# Patient Record
Sex: Male | Born: 1970 | Race: White | Hispanic: No | Marital: Married | Smoking: Never smoker
Health system: Southern US, Community
[De-identification: ages and names within clinical notes are randomized; demographics above are authoritative.]

## PROBLEM LIST (undated history)

## (undated) DIAGNOSIS — I10 Essential (primary) hypertension: Secondary | ICD-10-CM

## (undated) DIAGNOSIS — L509 Urticaria, unspecified: Secondary | ICD-10-CM

## (undated) DIAGNOSIS — N200 Calculus of kidney: Secondary | ICD-10-CM

## (undated) DIAGNOSIS — Z87442 Personal history of urinary calculi: Secondary | ICD-10-CM

## (undated) DIAGNOSIS — K5790 Diverticulosis of intestine, part unspecified, without perforation or abscess without bleeding: Secondary | ICD-10-CM

## (undated) DIAGNOSIS — E785 Hyperlipidemia, unspecified: Secondary | ICD-10-CM

## (undated) HISTORY — DX: Diverticulosis of intestine, part unspecified, without perforation or abscess without bleeding: K57.90

## (undated) HISTORY — DX: Calculus of kidney: N20.0

## (undated) HISTORY — DX: Essential (primary) hypertension: I10

## (undated) HISTORY — DX: Hyperlipidemia, unspecified: E78.5

## (undated) HISTORY — DX: Urticaria, unspecified: L50.9

---

## 2009-08-23 ENCOUNTER — Ambulatory Visit: Payer: Self-pay | Admitting: Nephrology

## 2009-11-20 ENCOUNTER — Ambulatory Visit: Payer: Self-pay | Admitting: Family Medicine

## 2010-11-25 ENCOUNTER — Ambulatory Visit: Payer: Self-pay | Admitting: Nephrology

## 2014-07-17 DIAGNOSIS — E785 Hyperlipidemia, unspecified: Secondary | ICD-10-CM | POA: Insufficient documentation

## 2014-07-17 DIAGNOSIS — I10 Essential (primary) hypertension: Secondary | ICD-10-CM | POA: Insufficient documentation

## 2015-04-28 ENCOUNTER — Other Ambulatory Visit: Payer: Self-pay | Admitting: Family Medicine

## 2015-04-28 NOTE — Telephone Encounter (Signed)
Last visit was October 2015; he was supposed to return April 2016 for a PE Last labs were October 2015 I'll approve Rx, but he needs appt with Dr. Jeananne Rama please

## 2015-05-08 ENCOUNTER — Encounter: Payer: Self-pay | Admitting: Family Medicine

## 2015-05-08 NOTE — Telephone Encounter (Signed)
L/M for pt to call and schedule appt.  Will also send letter.

## 2015-06-12 ENCOUNTER — Encounter: Payer: Self-pay | Admitting: Family Medicine

## 2015-06-12 ENCOUNTER — Ambulatory Visit (INDEPENDENT_AMBULATORY_CARE_PROVIDER_SITE_OTHER): Payer: BLUE CROSS/BLUE SHIELD | Admitting: Family Medicine

## 2015-06-12 VITALS — BP 135/90 | HR 67 | Ht 71.3 in | Wt 208.0 lb

## 2015-06-12 DIAGNOSIS — E785 Hyperlipidemia, unspecified: Secondary | ICD-10-CM | POA: Diagnosis not present

## 2015-06-12 DIAGNOSIS — Z23 Encounter for immunization: Secondary | ICD-10-CM

## 2015-06-12 DIAGNOSIS — I10 Essential (primary) hypertension: Secondary | ICD-10-CM | POA: Diagnosis not present

## 2015-06-12 DIAGNOSIS — Z0000 Encounter for general adult medical examination without abnormal findings: Secondary | ICD-10-CM | POA: Diagnosis not present

## 2015-06-12 LAB — UA/M W/RFLX CULTURE, ROUTINE
BILIRUBIN UA: NEGATIVE
Glucose, UA: NEGATIVE
KETONES UA: NEGATIVE
Leukocytes, UA: NEGATIVE
Nitrite, UA: NEGATIVE
PROTEIN UA: NEGATIVE
RBC, UA: NEGATIVE
SPEC GRAV UA: 1.025 (ref 1.005–1.030)
Urobilinogen, Ur: 0.2 mg/dL (ref 0.2–1.0)
pH, UA: 5 (ref 5.0–7.5)

## 2015-06-12 LAB — MICROALBUMIN, URINE WAIVED
CREATININE, URINE WAIVED: 300 mg/dL (ref 10–300)
MICROALB, UR WAIVED: 10 mg/L (ref 0–19)

## 2015-06-12 MED ORDER — LISINOPRIL 10 MG PO TABS
10.0000 mg | ORAL_TABLET | Freq: Every day | ORAL | Status: DC
Start: 2015-06-12 — End: 2015-09-24

## 2015-06-12 NOTE — Progress Notes (Signed)
BP 135/90 mmHg  Pulse 67  Ht 5' 11.3" (1.811 m)  Wt 208 lb (94.348 kg)  BMI 28.77 kg/m2  SpO2 100%   Subjective:    Patient ID: Nathaniel Young, male    DOB: [birth year], 45 y.o.   MRN: [id number]  HPI: DELNO IRIAS is a 45 y.o. male presenting on 06/12/2015 for comprehensive medical examination. Current medical complaints include:  HYPERTENSION Hypertension status: controlled  Satisfied with current treatment? yes Duration of hypertension: chronic BP monitoring frequency:  rarely BP range: 110s/80s BP medication side effects:  no Medication compliance: good compliance Aspirin: no Recurrent headaches: no Visual changes: no Palpitations: no Dyspnea: no Chest pain: no Lower extremity edema: no Dizzy/lightheaded: no  Had some problems with hemorrhoids after him being constipated. Would have some minor bleeding, no pain. Goes away with preparation H. Not having a problem now.   He currently lives with: wife Interim Problems from his last visit: no  Depression Screen done today and results listed below:  Depression screen [facility] 2/9 06/12/2015 06/12/2015  Decreased Interest 0 0  Down, Depressed, Hopeless 0 0  PHQ - 2 Score 0 0    The patient does not have a history of falls. I did not complete a risk assessment for falls. A plan of care for falls was not documented.  Past Medical History:  Past Medical History  Diagnosis Date  . Hyperlipidemia   . Hypertension   . Nephrolithiasis   . Diverticulosis   . Urticaria     Surgical History:  History reviewed. No pertinent past surgical history.  Medications:  No current outpatient prescriptions on file prior to visit.   No current facility-administered medications on file prior to visit.    Allergies:  Allergies  Allergen Reactions  . Robaxin [Methocarbamol] Rash    Social History:  Social History   Social History  . Marital Status: Married    Spouse Name: N/A  . Number of Children: N/A  . Years of Education:  N/A   Occupational History  . Not on file.   Social History Main Topics  . Smoking status: Never Smoker   . Smokeless tobacco: Never Used  . Alcohol Use: No  . Drug Use: No  . Sexual Activity: Yes   Other Topics Concern  . Not on file   Social History Narrative   History  Smoking status  . Never Smoker   Smokeless tobacco  . Never Used   History  Alcohol Use No    Family History:  Family History  Problem Relation Age of Onset  . Hypertension Mother   . Cancer Father     throat  . COPD Father   . AAA (abdominal aortic aneurysm) Father   . Aneurysm Father     brain    Past medical history, surgical history, medications, allergies, family history and social history reviewed with patient today and changes made to appropriate areas of the chart.   Review of Systems  Constitutional: Negative.   HENT: Negative.   Eyes: Negative.   Respiratory: Negative.   Cardiovascular: Negative.   Gastrointestinal: Positive for constipation and blood in stool. Negative for heartburn, nausea, vomiting, abdominal pain, diarrhea and melena.  Genitourinary: Negative.   Musculoskeletal: Positive for myalgias. Negative for back pain, joint pain, falls and neck pain.  Skin: Negative.   Neurological: Negative.   Endo/Heme/Allergies: Negative.  Negative for environmental allergies and polydipsia. Does not bruise/bleed easily.  Psychiatric/Behavioral: Negative.     All other  ROS negative except what is listed above and in the HPI.      Objective:    BP 135/90 mmHg  Pulse 67  Ht 5' 11.3" (1.811 m)  Wt 208 lb (94.348 kg)  BMI 28.77 kg/m2  SpO2 100%  Wt Readings from Last 3 Encounters:  06/12/15 208 lb (94.348 kg)  01/09/14 215 lb (97.523 kg)    Physical Exam  Constitutional: He is oriented to person, place, and time. He appears well-developed and well-nourished. No distress.  HENT:  Head: Normocephalic and atraumatic.  Right Ear: Hearing, tympanic membrane, external ear and  ear canal normal.  Left Ear: Hearing, tympanic membrane, external ear and ear canal normal.  Nose: Nose normal.  Mouth/Throat: Uvula is midline, oropharynx is clear and moist and mucous membranes are normal. No oropharyngeal exudate.  Eyes: Conjunctivae, EOM and lids are normal. Pupils are equal, round, and reactive to light. Right eye exhibits no discharge. Left eye exhibits no discharge. No scleral icterus.  Neck: Normal range of motion. Neck supple. No JVD present. No tracheal deviation present. No thyromegaly present.  Cardiovascular: Normal rate, regular rhythm, normal heart sounds and intact distal pulses.  Exam reveals no gallop and no friction rub.   No murmur heard. Pulmonary/Chest: Effort normal and breath sounds normal. No stridor. No respiratory distress. He has no wheezes. He has no rales. He exhibits no tenderness.  Abdominal: Soft. Bowel sounds are normal. He exhibits no distension and no mass. There is no tenderness. There is no rebound and no guarding. Hernia confirmed negative in the right inguinal area and confirmed negative in the left inguinal area.  Genitourinary: Testes normal and penis normal. Right testis shows no mass, no swelling and no tenderness. Right testis is descended. Cremasteric reflex is not absent on the right side. Left testis shows no swelling. Left testis is descended. Cremasteric reflex is not absent on the left side. Circumcised. No phimosis, paraphimosis, hypospadias, penile erythema or penile tenderness. No discharge found.  Musculoskeletal: Normal range of motion. He exhibits no edema or tenderness.  Lymphadenopathy:    He has no cervical adenopathy.       Right: No inguinal adenopathy present.       Left: No inguinal adenopathy present.  Neurological: He is alert and oriented to person, place, and time. He has normal reflexes. He displays normal reflexes. No cranial nerve deficit. He exhibits normal muscle tone. Coordination normal.  Skin: Skin is warm,  dry and intact. No rash noted. He is not diaphoretic. No erythema. No pallor.  Psychiatric: He has a normal mood and affect. His speech is normal and behavior is normal. Judgment and thought content normal. Cognition and memory are normal.  Nursing note and vitals reviewed.   No results found for this or any previous visit.    Assessment & Plan:   Problem List Items Addressed This Visit      Cardiovascular and Mediastinum   Hypertension    Under good control. Continue current regimen. Continue to monitor. Call with any concerns.       Relevant Medications   lisinopril (PRINIVIL,ZESTRIL) 10 MG tablet   Other Relevant Orders   Comprehensive metabolic panel   Microalbumin, Urine Waived     Other   Hyperlipidemia    Rechecking levels today. Await results. Continue diet and exercise.       Relevant Medications   lisinopril (PRINIVIL,ZESTRIL) 10 MG tablet   Other Relevant Orders   Comprehensive metabolic panel   Lipid Panel w/o  Chol/HDL Ratio    Other Visit Diagnoses    Routine general medical examination at a health care facility    -  Primary    Screening labs checked today. Up to date on his vaccines. Continue diet and exercise. Continue to monitor.     Relevant Orders    CBC with Differential/Platelet    Comprehensive metabolic panel    Lipid Panel w/o Chol/HDL Ratio    Microalbumin, Urine Waived    TSH    UA/M w/rflx Culture, Routine    Immunization due        Tdap given today.         LABORATORY TESTING:  Health maintenance labs ordered today as discussed above.   IMMUNIZATIONS:   - Tdap: Tetanus vaccination status reviewed: Td vaccination indicated and given today. - Influenza: Refused - Pneumovax: Not applicable - Prevnar: Not applicable - Zostavax vaccine: Not applicable  PATIENT COUNSELING:    Sexuality: Discussed sexually transmitted diseases, partner selection, use of condoms, avoidance of unintended pregnancy  and contraceptive alternatives.    Advised to avoid cigarette smoking.  I discussed with the patient that most people either abstain from alcohol or drink within safe limits (<=14/week and <=4 drinks/occasion for males, <=7/weeks and <= 3 drinks/occasion for females) and that the risk for alcohol disorders and other health effects rises proportionally with the number of drinks per week and how often a drinker exceeds daily limits.  Discussed cessation/primary prevention of drug use and availability of treatment for abuse.   Diet: Encouraged to adjust caloric intake to maintain  or achieve ideal body weight, to reduce intake of dietary saturated fat and total fat, to limit sodium intake by avoiding high sodium foods and not adding table salt, and to maintain adequate dietary potassium and calcium preferably from fresh fruits, vegetables, and low-fat dairy products.    stressed the importance of regular exercise  Injury prevention: Discussed safety belts, safety helmets, smoke detector, smoking near bedding or upholstery.   Dental health: Discussed importance of regular tooth brushing, flossing, and dental visits.   Follow up plan: NEXT PREVENTATIVE PHYSICAL DUE IN 1 YEAR. Return in about 6 months (around 12/13/2015) for BP check.

## 2015-06-12 NOTE — Patient Instructions (Signed)

## 2015-06-12 NOTE — Assessment & Plan Note (Signed)
Under good control. Continue current regimen. Continue to monitor. Call with any concerns. 

## 2015-06-12 NOTE — Assessment & Plan Note (Signed)
Rechecking levels today. Await results. Continue diet and exercise.  

## 2015-06-13 ENCOUNTER — Encounter: Payer: Self-pay | Admitting: Family Medicine

## 2015-06-13 LAB — COMPREHENSIVE METABOLIC PANEL
ALBUMIN: 4.5 g/dL (ref 3.5–5.5)
ALT: 26 IU/L (ref 0–44)
AST: 25 IU/L (ref 0–40)
Albumin/Globulin Ratio: 1.7 (ref 1.1–2.5)
Alkaline Phosphatase: 53 IU/L (ref 39–117)
BILIRUBIN TOTAL: 0.5 mg/dL (ref 0.0–1.2)
BUN / CREAT RATIO: 13 (ref 9–20)
BUN: 15 mg/dL (ref 6–24)
CO2: 24 mmol/L (ref 18–29)
Calcium: 9.9 mg/dL (ref 8.7–10.2)
Chloride: 100 mmol/L (ref 96–106)
Creatinine, Ser: 1.15 mg/dL (ref 0.76–1.27)
GFR calc non Af Amer: 76 mL/min/{1.73_m2} (ref >59)
GFR, EST AFRICAN AMERICAN: 88 mL/min/{1.73_m2} (ref >59)
GLOBULIN, TOTAL: 2.6 g/dL (ref 1.5–4.5)
Glucose: 112 mg/dL — ABNORMAL HIGH (ref 65–99)
Potassium: 5.1 mmol/L (ref 3.5–5.2)
Sodium: 143 mmol/L (ref 134–144)
TOTAL PROTEIN: 7.1 g/dL (ref 6.0–8.5)

## 2015-06-13 LAB — LIPID PANEL W/O CHOL/HDL RATIO
Cholesterol, Total: 239 mg/dL — ABNORMAL HIGH (ref 100–199)
HDL: 38 mg/dL — ABNORMAL LOW (ref >39)
LDL Calculated: 141 mg/dL — ABNORMAL HIGH (ref 0–99)
Triglycerides: 302 mg/dL — ABNORMAL HIGH (ref 0–149)
VLDL CHOLESTEROL CAL: 60 mg/dL — AB (ref 5–40)

## 2015-06-13 LAB — CBC WITH DIFFERENTIAL/PLATELET
BASOS: 0 %
Basophils Absolute: 0 10*3/uL (ref 0.0–0.2)
EOS (ABSOLUTE): 0.1 10*3/uL (ref 0.0–0.4)
EOS: 2 %
HEMATOCRIT: 46.4 % (ref 37.5–51.0)
HEMOGLOBIN: 15.9 g/dL (ref 12.6–17.7)
IMMATURE GRANS (ABS): 0 10*3/uL (ref 0.0–0.1)
Immature Granulocytes: 0 %
LYMPHS: 31 %
Lymphocytes Absolute: 1.7 10*3/uL (ref 0.7–3.1)
MCH: 29.6 pg (ref 26.6–33.0)
MCHC: 34.3 g/dL (ref 31.5–35.7)
MCV: 86 fL (ref 79–97)
MONOCYTES: 8 %
Monocytes Absolute: 0.4 10*3/uL (ref 0.1–0.9)
NEUTROS ABS: 3.1 10*3/uL (ref 1.4–7.0)
Neutrophils: 59 %
Platelets: 230 10*3/uL (ref 150–379)
RBC: 5.37 x10E6/uL (ref 4.14–5.80)
RDW: 14.2 % (ref 12.3–15.4)
WBC: 5.4 10*3/uL (ref 3.4–10.8)

## 2015-06-13 LAB — TSH: TSH: 0.89 u[IU]/mL (ref 0.450–4.500)

## 2015-09-24 ENCOUNTER — Encounter: Payer: Self-pay | Admitting: Family Medicine

## 2015-09-24 ENCOUNTER — Ambulatory Visit (INDEPENDENT_AMBULATORY_CARE_PROVIDER_SITE_OTHER): Payer: BLUE CROSS/BLUE SHIELD | Admitting: Family Medicine

## 2015-09-24 VITALS — BP 140/80 | HR 75 | Temp 98.7°F | Ht 71.3 in | Wt 207.0 lb

## 2015-09-24 DIAGNOSIS — I10 Essential (primary) hypertension: Secondary | ICD-10-CM | POA: Diagnosis not present

## 2015-09-24 MED ORDER — LISINOPRIL 10 MG PO TABS: 5.0000 mg | ORAL_TABLET | Freq: Every day | ORAL | Status: DC

## 2015-09-24 NOTE — Progress Notes (Signed)
BP 140/80 mmHg  Pulse 75  Temp(Src) 98.7 F (37.1 C)  Ht 5' 11.3" (1.811 m)  Wt 207 lb (93.895 kg)  BMI 28.63 kg/m2  SpO2 100%   Subjective:    Patient ID: Nathaniel Young, male    DOB: [birth year], 45 y.o.   MRN: [id number]  HPI: Nathaniel LINDSKOG is a 45 y.o. male  Chief Complaint  Patient presents with  . Hypertension    [place] he had an episode where he felt like he was going to pass out, his friend checked his bp and it was 90's/60's. He has not taken his medicine since Friday morning.   HYPERTENSION- Had 1 similar issues about 6-8 months ago when he hit his elbow really hard. Was on the boat right before this happened and his leg cramped really bad and got really cold sweat and got tingly and felt like he was going to pass out, but he didn't. Hasn't taken his BP since Friday.  Hypertension status: fluctuating  Satisfied with current treatment? no Duration of hypertension: chronic BP monitoring frequency:  rarely BP range: 120s/80s BP medication side effects:  no Medication compliance: fair compliance Previous BP meds: lisinopril Aspirin: no Recurrent headaches: no Visual changes: no Palpitations: no Dyspnea: no Chest pain: no Lower extremity edema: no Dizzy/lightheaded: yes  Relevant past medical, surgical, family and social history reviewed and updated as indicated. Interim medical history since our last visit reviewed. Allergies and medications reviewed and updated.  Review of Systems  Constitutional: Negative.   Respiratory: Negative.   Cardiovascular: Negative.   Psychiatric/Behavioral: Negative.     Per HPI unless specifically indicated above     Objective:    BP 140/80 mmHg  Pulse 75  Temp(Src) 98.7 F (37.1 C)  Ht 5' 11.3" (1.811 m)  Wt 207 lb (93.895 kg)  BMI 28.63 kg/m2  SpO2 100%  Wt Readings from Last 3 Encounters:  09/24/15 207 lb (93.895 kg)  06/12/15 208 lb (94.348 kg)  01/09/14 215 lb  (97.523 kg)   Orthostatic VS for the past 24 hrs:  BP- Lying Pulse- Lying BP- Sitting Pulse- Sitting BP- Standing at 0 minutes Pulse- Standing at 0 minutes  09/24/15 1439 133/84 mmHg 69 135/83 mmHg 66 134/88 mmHg 70   Physical Exam  Constitutional: He is oriented to person, place, and time. He appears well-developed and well-nourished. No distress.  HENT:  Head: Normocephalic and atraumatic.  Right Ear: Hearing normal.  Left Ear: Hearing normal.  Nose: Nose normal.  Eyes: Conjunctivae and lids are normal. Right eye exhibits no discharge. Left eye exhibits no discharge. No scleral icterus.  Cardiovascular: Normal rate, regular rhythm, normal heart sounds and intact distal pulses.  Exam reveals no gallop and no friction rub.   No murmur heard. Pulmonary/Chest: Effort normal and breath sounds normal. No respiratory distress. He has no wheezes. He has no rales. He exhibits no tenderness.  Musculoskeletal: Normal range of motion.  Neurological: He is alert and oriented to person, place, and time.  Skin: Skin is warm, dry and intact. No rash noted. He is not diaphoretic. No erythema. No pallor.  Psychiatric: He has a normal mood and affect. His speech is normal and behavior is normal. Judgment and thought content normal. Cognition and memory are normal.  Nursing note and vitals reviewed.   Results for orders placed or performed in visit on 06/12/15  CBC with Differential/Platelet  Result Value Ref Range  WBC 5.4 3.4 - 10.8 x10E3/uL   RBC 5.37 4.14 - 5.80 x10E6/uL   Hemoglobin 15.9 12.6 - 17.7 g/dL   Hematocrit 46.4 37.5 - 51.0 %   MCV 86 79 - 97 fL   MCH 29.6 26.6 - 33.0 pg   MCHC 34.3 31.5 - 35.7 g/dL   RDW 14.2 12.3 - 15.4 %   Platelets 230 150 - 379 x10E3/uL   Neutrophils 59 %   Lymphs 31 %   Monocytes 8 %   Eos 2 %   Basos 0 %   Neutrophils Absolute 3.1 1.4 - 7.0 x10E3/uL   Lymphocytes Absolute 1.7 0.7 - 3.1 x10E3/uL   Monocytes Absolute 0.4 0.1 - 0.9 x10E3/uL   EOS  (ABSOLUTE) 0.1 0.0 - 0.4 x10E3/uL   Basophils Absolute 0.0 0.0 - 0.2 x10E3/uL   Immature Granulocytes 0 %   Immature Grans (Abs) 0.0 0.0 - 0.1 x10E3/uL  Comprehensive metabolic panel  Result Value Ref Range   Glucose 112 (H) 65 - 99 mg/dL   BUN 15 6 - 24 mg/dL   Creatinine, Ser 1.15 0.76 - 1.27 mg/dL   GFR calc non Af Amer 76 >59 mL/min/1.73   GFR calc Af Amer 88 >59 mL/min/1.73   BUN/Creatinine Ratio 13 9 - 20   Sodium 143 134 - 144 mmol/L   Potassium 5.1 3.5 - 5.2 mmol/L   Chloride 100 96 - 106 mmol/L   CO2 24 18 - 29 mmol/L   Calcium 9.9 8.7 - 10.2 mg/dL   Total Protein 7.1 6.0 - 8.5 g/dL   Albumin 4.5 3.5 - 5.5 g/dL   Globulin, Total 2.6 1.5 - 4.5 g/dL   Albumin/Globulin Ratio 1.7 1.1 - 2.5   Bilirubin Total 0.5 0.0 - 1.2 mg/dL   Alkaline Phosphatase 53 39 - 117 IU/L   AST 25 0 - 40 IU/L   ALT 26 0 - 44 IU/L  Lipid Panel w/o Chol/HDL Ratio  Result Value Ref Range   Cholesterol, Total 239 (H) 100 - 199 mg/dL   Triglycerides 302 (H) 0 - 149 mg/dL   HDL 38 (L) >39 mg/dL   VLDL Cholesterol Cal 60 (H) 5 - 40 mg/dL   LDL Calculated 141 (H) 0 - 99 mg/dL  Microalbumin, Urine Waived  Result Value Ref Range   Microalb, Ur Waived 10 0 - 19 mg/L   Creatinine, Urine Waived 300 10 - 300 mg/dL   Microalb/Creat Ratio <30 <30 mg/g  TSH  Result Value Ref Range   TSH 0.890 0.450 - 4.500 uIU/mL  UA/M w/rflx Culture, Routine  Result Value Ref Range   Specific Gravity, UA 1.025 1.005 - 1.030   pH, UA 5.0 5.0 - 7.5   Color, UA Yellow Yellow   Appearance Ur Clear Clear   Leukocytes, UA Negative Negative   Protein, UA Negative Negative/Trace   Glucose, UA Negative Negative   Ketones, UA Negative Negative   RBC, UA Negative Negative   Bilirubin, UA Negative Negative   Urobilinogen, Ur 0.2 0.2 - 1.0 mg/dL   Nitrite, UA Negative Negative      Assessment & Plan:   Problem List Items Addressed This Visit      Cardiovascular and Mediastinum   Hypertension - Primary    [place] now [place] medicine and BP elevated. Will restart 5mg  of lisinopril. Will check for electrolyte abnormalities and CBC. Await results. Recheck 1 month. No orthostasis today.      Relevant Medications   lisinopril (  PRINIVIL,ZESTRIL) 10 MG tablet   Other Relevant Orders   CBC with Differential/Platelet   Comprehensive metabolic panel       Follow up plan: Return in about 4 weeks (around 10/22/2015) for BP follow up.

## 2015-09-24 NOTE — Assessment & Plan Note (Addendum)
[place] now [place] and BP elevated. Will restart 5mg  of lisinopril. Will check for electrolyte abnormalities and CBC. Await results. Recheck 1 month. No orthostasis today.

## 2015-09-25 ENCOUNTER — Encounter: Payer: Self-pay | Admitting: Family Medicine

## 2015-09-25 LAB — COMPREHENSIVE METABOLIC PANEL
A/G RATIO: 1.7 (ref 1.2–2.2)
ALBUMIN: 4.4 g/dL (ref 3.5–5.5)
ALT: 20 IU/L (ref 0–44)
AST: 18 IU/L (ref 0–40)
Alkaline Phosphatase: 49 IU/L (ref 39–117)
BILIRUBIN TOTAL: 0.6 mg/dL (ref 0.0–1.2)
BUN / CREAT RATIO: 11 (ref 9–20)
BUN: 12 mg/dL (ref 6–24)
CALCIUM: 9.8 mg/dL (ref 8.7–10.2)
CHLORIDE: 101 mmol/L (ref 96–106)
CO2: 27 mmol/L (ref 18–29)
Creatinine, Ser: 1.11 mg/dL (ref 0.76–1.27)
GFR, EST AFRICAN AMERICAN: 92 mL/min/{1.73_m2} (ref >59)
GFR, EST NON AFRICAN AMERICAN: 80 mL/min/{1.73_m2} (ref >59)
GLOBULIN, TOTAL: 2.6 g/dL (ref 1.5–4.5)
Glucose: 71 mg/dL (ref 65–99)
POTASSIUM: 5 mmol/L (ref 3.5–5.2)
SODIUM: 144 mmol/L (ref 134–144)
TOTAL PROTEIN: 7 g/dL (ref 6.0–8.5)

## 2015-09-25 LAB — CBC WITH DIFFERENTIAL/PLATELET
BASOS: 0 %
Basophils Absolute: 0 10*3/uL (ref 0.0–0.2)
EOS (ABSOLUTE): 0.1 10*3/uL (ref 0.0–0.4)
EOS: 2 %
HEMATOCRIT: 44 % (ref 37.5–51.0)
HEMOGLOBIN: 14.8 g/dL (ref 12.6–17.7)
IMMATURE GRANS (ABS): 0 10*3/uL (ref 0.0–0.1)
IMMATURE GRANULOCYTES: 0 %
LYMPHS: 32 %
Lymphocytes Absolute: 2.1 10*3/uL (ref 0.7–3.1)
MCH: 29.1 pg (ref 26.6–33.0)
MCHC: 33.6 g/dL (ref 31.5–35.7)
MCV: 87 fL (ref 79–97)
Monocytes Absolute: 0.5 10*3/uL (ref 0.1–0.9)
Monocytes: 7 %
NEUTROS ABS: 3.8 10*3/uL (ref 1.4–7.0)
NEUTROS PCT: 59 %
PLATELETS: 233 10*3/uL (ref 150–379)
RBC: 5.08 x10E6/uL (ref 4.14–5.80)
RDW: 13.4 % (ref 12.3–15.4)
WBC: 6.5 10*3/uL (ref 3.4–10.8)

## 2015-10-29 ENCOUNTER — Other Ambulatory Visit: Payer: Self-pay | Admitting: Family Medicine

## 2015-10-29 ENCOUNTER — Ambulatory Visit (INDEPENDENT_AMBULATORY_CARE_PROVIDER_SITE_OTHER): Payer: BLUE CROSS/BLUE SHIELD | Admitting: Family Medicine

## 2015-10-29 ENCOUNTER — Encounter: Payer: Self-pay | Admitting: Family Medicine

## 2015-10-29 DIAGNOSIS — I10 Essential (primary) hypertension: Secondary | ICD-10-CM

## 2015-10-29 MED ORDER — LISINOPRIL 5 MG PO TABS: 5.0000 mg | ORAL_TABLET | Freq: Every day | ORAL | 6 refills | Status: DC

## 2015-10-29 MED ORDER — LISINOPRIL 5 MG PO TABS: 5.0000 mg | ORAL_TABLET | Freq: Every day | ORAL | 9 refills | Status: DC

## 2015-10-29 NOTE — Progress Notes (Signed)
BP 126/79 (BP Location: Left Arm, Patient Position: Sitting, Cuff Size: Normal)   Pulse 74   Temp 98.1 F (36.7 C)   Wt 204 lb (92.5 kg)   SpO2 100%   BMI 28.21 kg/m    Subjective:    Patient ID: Nathaniel Young, male    DOB: 07/13/1970, 45 y.o.   MRN: [id number]  HPI: Nathaniel Young is a 45 y.o. male  Chief Complaint  Patient presents with  . Hypertension   HYPERTENSION Hypertension status: controlled  Satisfied with current treatment? yes Duration of hypertension: chronic BP monitoring frequency:  not checking BP medication side effects:  no Medication compliance: excellent compliance Aspirin: no Recurrent headaches: no Visual changes: no Palpitations: no Dyspnea: no Chest pain: no Lower extremity edema: no Dizzy/lightheaded: no  Relevant past medical, surgical, family and social history reviewed and updated as indicated. Interim medical history since our last visit reviewed. Allergies and medications reviewed and updated.  Review of Systems  Constitutional: Negative.   Respiratory: Negative.   Cardiovascular: Negative.   Psychiatric/Behavioral: Negative.     Per HPI unless specifically indicated above     Objective:    BP 126/79 (BP Location: Left Arm, Patient Position: Sitting, Cuff Size: Normal)   Pulse 74   Temp 98.1 F (36.7 C)   Wt 204 lb (92.5 kg)   SpO2 100%   BMI 28.21 kg/m   Wt Readings from Last 3 Encounters:  10/29/15 204 lb (92.5 kg)  09/24/15 207 lb (93.9 kg)  06/12/15 208 lb (94.3 kg)    Physical Exam  Constitutional: He is oriented to person, place, and time. He appears well-developed and well-nourished. No distress.  HENT:  Head: Normocephalic and atraumatic.  Right Ear: Hearing normal.  Left Ear: Hearing normal.  Nose: Nose normal.  Eyes: Conjunctivae and lids are normal. Right eye exhibits no discharge. Left eye exhibits no discharge. No scleral icterus.  Cardiovascular: Normal rate, regular rhythm, normal heart sounds  and intact distal pulses.  Exam reveals no gallop and no friction rub.   No murmur heard. Pulmonary/Chest: Effort normal and breath sounds normal. No respiratory distress. He has no wheezes. He has no rales. He exhibits no tenderness.  Musculoskeletal: Normal range of motion.  Neurological: He is alert and oriented to person, place, and time.  Skin: Skin is intact. No rash noted.  Psychiatric: He has a normal mood and affect. His speech is normal and behavior is normal. Judgment and thought content normal. Cognition and memory are normal.  Nursing note and vitals reviewed.   Results for orders placed or performed in visit on 09/24/15  CBC with Differential/Platelet  Result Value Ref Range   WBC 6.5 3.4 - 10.8 x10E3/uL   RBC 5.08 4.14 - 5.80 x10E6/uL   Hemoglobin 14.8 12.6 - 17.7 g/dL   Hematocrit 44.0 37.5 - 51.0 %   MCV 87 79 - 97 fL   MCH 29.1 26.6 - 33.0 pg   MCHC 33.6 31.5 - 35.7 g/dL   RDW 13.4 12.3 - 15.4 %   Platelets 233 150 - 379 x10E3/uL   Neutrophils 59 %   Lymphs 32 %   Monocytes 7 %   Eos 2 %   Basos 0 %   Neutrophils Absolute 3.8 1.4 - 7.0 x10E3/uL   Lymphocytes Absolute 2.1 0.7 - 3.1 x10E3/uL   Monocytes Absolute 0.5 0.1 - 0.9 x10E3/uL   EOS (ABSOLUTE) 0.1 0.0 - 0.4 x10E3/uL   Basophils Absolute 0.0 0.0 -  0.2 x10E3/uL   Immature Granulocytes 0 %   Immature Grans (Abs) 0.0 0.0 - 0.1 x10E3/uL  Comprehensive metabolic panel  Result Value Ref Range   Glucose 71 65 - 99 mg/dL   BUN 12 6 - 24 mg/dL   Creatinine, Ser 1.11 0.76 - 1.27 mg/dL   GFR calc non Af Amer 80 >59 mL/min/1.73   GFR calc Af Amer 92 >59 mL/min/1.73   BUN/Creatinine Ratio 11 9 - 20   Sodium 144 134 - 144 mmol/L   Potassium 5.0 3.5 - 5.2 mmol/L   Chloride 101 96 - 106 mmol/L   CO2 27 18 - 29 mmol/L   Calcium 9.8 8.7 - 10.2 mg/dL   Total Protein 7.0 6.0 - 8.5 g/dL   Albumin 4.4 3.5 - 5.5 g/dL   Globulin, Total 2.6 1.5 - 4.5 g/dL   Albumin/Globulin Ratio 1.7 1.2 - 2.2   Bilirubin Total 0.6  0.0 - 1.2 mg/dL   Alkaline Phosphatase 49 39 - 117 IU/L   AST 18 0 - 40 IU/L   ALT 20 0 - 44 IU/L      Assessment & Plan:   Problem List Items Addressed This Visit      Cardiovascular and Mediastinum   Hypertension    Under good control. Continue current regimen. Continue to monitor. Call with any concerns. Recheck at physical in March.      Relevant Medications   lisinopril (PRINIVIL,ZESTRIL) 5 MG tablet    Other Visit Diagnoses   None.      Follow up plan: Return After 06/11/16, for physical.

## 2015-10-29 NOTE — Assessment & Plan Note (Signed)
Under good control. Continue current regimen. Continue to monitor. Call with any concerns. Recheck at physical in March.

## 2015-10-30 LAB — BASIC METABOLIC PANEL
BUN/Creatinine Ratio: 13 (ref 9–20)
BUN: 15 mg/dL (ref 6–24)
CALCIUM: 9.7 mg/dL (ref 8.7–10.2)
CHLORIDE: 101 mmol/L (ref 96–106)
CO2: 26 mmol/L (ref 18–29)
Creatinine, Ser: 1.18 mg/dL (ref 0.76–1.27)
GFR calc non Af Amer: 74 mL/min/{1.73_m2} (ref >59)
GFR, EST AFRICAN AMERICAN: 86 mL/min/{1.73_m2} (ref >59)
GLUCOSE: 84 mg/dL (ref 65–99)
POTASSIUM: 5 mmol/L (ref 3.5–5.2)
Sodium: 143 mmol/L (ref 134–144)

## 2015-12-13 ENCOUNTER — Ambulatory Visit: Payer: BLUE CROSS/BLUE SHIELD | Admitting: Family Medicine

## 2016-06-12 ENCOUNTER — Ambulatory Visit (INDEPENDENT_AMBULATORY_CARE_PROVIDER_SITE_OTHER): Payer: BLUE CROSS/BLUE SHIELD | Admitting: Family Medicine

## 2016-06-12 ENCOUNTER — Encounter: Payer: Self-pay | Admitting: Family Medicine

## 2016-06-12 VITALS — BP 132/89 | HR 79 | Temp 98.1°F | Resp 17 | Ht 71.5 in | Wt 203.0 lb

## 2016-06-12 DIAGNOSIS — E782 Mixed hyperlipidemia: Secondary | ICD-10-CM

## 2016-06-12 DIAGNOSIS — I10 Essential (primary) hypertension: Secondary | ICD-10-CM | POA: Diagnosis not present

## 2016-06-12 DIAGNOSIS — Z0000 Encounter for general adult medical examination without abnormal findings: Secondary | ICD-10-CM

## 2016-06-12 LAB — MICROALBUMIN, URINE WAIVED
CREATININE, URINE WAIVED: 300 mg/dL (ref 10–300)
Microalb, Ur Waived: 30 mg/L — ABNORMAL HIGH (ref 0–19)

## 2016-06-12 LAB — UA/M W/RFLX CULTURE, ROUTINE
BILIRUBIN UA: NEGATIVE
GLUCOSE, UA: NEGATIVE
KETONES UA: NEGATIVE
Leukocytes, UA: NEGATIVE
NITRITE UA: NEGATIVE
PROTEIN UA: NEGATIVE
RBC UA: NEGATIVE
Specific Gravity, UA: >1.03 — ABNORMAL HIGH (ref 1.005–1.030)
Urobilinogen, Ur: 0.2 mg/dL (ref 0.2–1.0)
pH, UA: 5.5 (ref 5.0–7.5)

## 2016-06-12 LAB — MICROSCOPIC EXAMINATION
Bacteria, UA: NONE SEEN
Epithelial Cells (non renal): NONE SEEN /hpf (ref 0–10)
RBC MICROSCOPIC, UA: NONE SEEN /HPF (ref 0–?)
WBC UA: NONE SEEN /HPF (ref 0–?)

## 2016-06-12 MED ORDER — LISINOPRIL 5 MG PO TABS: 5.0000 mg | ORAL_TABLET | Freq: Every day | ORAL | 9 refills | Status: DC

## 2016-06-12 NOTE — Progress Notes (Signed)
BP 132/89 (BP Location: Left Arm, Patient Position: Sitting, Cuff Size: Normal)   Pulse 79   Temp 98.1 F (36.7 C) (Oral)   Resp 17   Ht 5' 11.5" (1.816 m)   Wt 203 lb (92.1 kg)   SpO2 98%   BMI 27.92 kg/m    Subjective:    Patient ID: Nathaniel Young, male    DOB: Jan 31, 1971, 46 y.o.   MRN: [id number]  HPI: Nathaniel Young is a 46 y.o. male presenting on 06/12/2016 for comprehensive medical examination. Current medical complaints include:  HYPERTENSION Hypertension status: controlled  Satisfied with current treatment? yes Duration of hypertension: chronic BP monitoring frequency:  not checking BP medication side effects:  no Medication compliance: excellent compliance Previous BP meds:lisinopril Aspirin: no Recurrent headaches: no Visual changes: no Palpitations: no Dyspnea: no Chest pain: no Lower extremity edema: no Dizzy/lightheaded: no  He currently lives with: wife Interim Problems from his last visit: no  Depression Screen done today and results listed below:  Depression screen [facility] 2/9 06/12/2016 06/12/2015 06/12/2015  Decreased Interest 0 0 0  Down, Depressed, Hopeless 0 0 0  PHQ - 2 Score 0 0 0    Past Medical History:  Past Medical History:  Diagnosis Date  . Diverticulosis   . Hyperlipidemia   . Hypertension   . Nephrolithiasis   . Urticaria     Surgical History:  No past surgical history on file.  Medications:  No current outpatient prescriptions on file prior to visit.   No current facility-administered medications on file prior to visit.     Allergies:  Allergies  Allergen Reactions  . Robaxin [Methocarbamol] Rash    Social History:  Social History   Social History  . Marital status: Married    Spouse name: N/A  . Number of children: N/A  . Years of education: N/A   Occupational History  . Not on file.   Social History Main Topics  . Smoking status: Never Smoker  . Smokeless tobacco: Never Used  . Alcohol use No  . Drug  use: No  . Sexual activity: Yes   Other Topics Concern  . Not on file   Social History Narrative  . No narrative on file   History  Smoking Status  . Never Smoker  Smokeless Tobacco  . Never Used   History  Alcohol Use No    Family History:  Family History  Problem Relation Age of Onset  . Hypertension Mother   . Cancer Father     throat  . COPD Father   . AAA (abdominal aortic aneurysm) Father   . Aneurysm Father     brain    Past medical history, surgical history, medications, allergies, family history and social history reviewed with patient today and changes made to appropriate areas of the chart.   Review of Systems  Constitutional: Negative.   HENT: Negative.   Eyes: Negative.   Respiratory: Negative.   Cardiovascular: Negative.   Gastrointestinal: Negative.   Genitourinary: Negative.   Musculoskeletal: Negative.   Skin: Negative.   Neurological: Negative.   Endo/Heme/Allergies: Negative.   Psychiatric/Behavioral: Negative.     All other ROS negative except what is listed above and in the HPI.      Objective:    BP 132/89 (BP Location: Left Arm, Patient Position: Sitting, Cuff Size: Normal)   Pulse 79   Temp 98.1 F (36.7 C) (Oral)   Resp 17   Ht 5' 11.5" (1.816 m)  Wt 203 lb (92.1 kg)   SpO2 98%   BMI 27.92 kg/m   Wt Readings from Last 3 Encounters:  06/12/16 203 lb (92.1 kg)  10/29/15 204 lb (92.5 kg)  09/24/15 207 lb (93.9 kg)    Physical Exam  Constitutional: He is oriented to person, place, and time. He appears well-developed and well-nourished. No distress.  HENT:  Head: Normocephalic and atraumatic.  Right Ear: Hearing, tympanic membrane, external ear and ear canal normal.  Left Ear: Hearing, tympanic membrane, external ear and ear canal normal.  Nose: Nose normal.  Mouth/Throat: Uvula is midline, oropharynx is clear and moist and mucous membranes are normal. No oropharyngeal exudate.  Eyes: Conjunctivae, EOM and lids are  normal. Pupils are equal, round, and reactive to light. Right eye exhibits no discharge. Left eye exhibits no discharge. No scleral icterus.  Neck: Normal range of motion. Neck supple. No JVD present. No tracheal deviation present. No thyromegaly present.  Cardiovascular: Normal rate, regular rhythm, normal heart sounds and intact distal pulses.  Exam reveals no gallop and no friction rub.   No murmur heard. Pulmonary/Chest: Effort normal and breath sounds normal. No stridor. No respiratory distress. He has no wheezes. He has no rales. He exhibits no tenderness.  Abdominal: Soft. Bowel sounds are normal. He exhibits no distension and no mass. There is no tenderness. There is no rebound and no guarding. Hernia confirmed negative in the right inguinal area and confirmed negative in the left inguinal area.  Genitourinary: Testes normal and penis normal. Right testis shows no mass, no swelling and no tenderness. Right testis is descended. Cremasteric reflex is not absent on the right side. Left testis shows no mass, no swelling and no tenderness. Left testis is descended. Cremasteric reflex is not absent on the left side. Circumcised. No penile tenderness.  Musculoskeletal: Normal range of motion. He exhibits no edema, tenderness or deformity.  Lymphadenopathy:    He has no cervical adenopathy.       Right: No inguinal adenopathy present.       Left: No inguinal adenopathy present.  Neurological: He is alert and oriented to person, place, and time. He has normal reflexes. He displays normal reflexes. No cranial nerve deficit. He exhibits normal muscle tone. Coordination normal.  Skin: Skin is warm, dry and intact. No rash noted. He is not diaphoretic. No erythema. No pallor.  Psychiatric: He has a normal mood and affect. His speech is normal and behavior is normal. Judgment and thought content normal. Cognition and memory are normal.  Nursing note and vitals reviewed.   Results for orders placed or  performed in visit on 01/31/24  Basic metabolic panel  Result Value Ref Range   Glucose 84 65 - 99 mg/dL   BUN 15 6 - 24 mg/dL   Creatinine, Ser 1.18 0.76 - 1.27 mg/dL   GFR calc non Af Amer 74 >59 mL/min/1.73   GFR calc Af Amer 86 >59 mL/min/1.73   BUN/Creatinine Ratio 13 9 - 20   Sodium 143 134 - 144 mmol/L   Potassium 5.0 3.5 - 5.2 mmol/L   Chloride 101 96 - 106 mmol/L   CO2 26 18 - 29 mmol/L   Calcium 9.7 8.7 - 10.2 mg/dL      Assessment & Plan:   Problem List Items Addressed This Visit      Cardiovascular and Mediastinum   Hypertension    Under good control. Continue current regimen. Continue to monitor. Recheck 6 months.  Relevant Medications   lisinopril (PRINIVIL,ZESTRIL) 5 MG tablet   Other Relevant Orders   CBC with Differential/Platelet   Comprehensive metabolic panel   Microalbumin, Urine Waived   TSH   UA/M w/rflx Culture, Routine     Other   Hyperlipidemia    Rechecking levels today. Call with any concerns. Await results.       Relevant Medications   lisinopril (PRINIVIL,ZESTRIL) 5 MG tablet   Other Relevant Orders   Comprehensive metabolic panel   Lipid Panel w/o Chol/HDL Ratio    Other Visit Diagnoses    Routine general medical examination at a health care facility    -  Primary   Up to date on vaccines. Screening labs checked today. Call with any concerns. Recheck 1 year.    Relevant Orders   CBC with Differential/Platelet   Comprehensive metabolic panel   Lipid Panel w/o Chol/HDL Ratio   Microalbumin, Urine Waived   TSH   UA/M w/rflx Culture, Routine      LABORATORY TESTING:  Health maintenance labs ordered today as discussed above.   IMMUNIZATIONS:   - Tdap: Tetanus vaccination status reviewed: last tetanus booster within 10 years. - Influenza: Refused - Pneumovax: Not applicable  PATIENT COUNSELING:    Sexuality: Discussed sexually transmitted diseases, partner selection, use of condoms, avoidance of unintended pregnancy  and  contraceptive alternatives.   Advised to avoid cigarette smoking.  I discussed with the patient that most people either abstain from alcohol or drink within safe limits (<=14/week and <=4 drinks/occasion for males, <=7/weeks and <= 3 drinks/occasion for females) and that the risk for alcohol disorders and other health effects rises proportionally with the number of drinks per week and how often a drinker exceeds daily limits.  Discussed cessation/primary prevention of drug use and availability of treatment for abuse.   Diet: Encouraged to adjust caloric intake to maintain  or achieve ideal body weight, to reduce intake of dietary saturated fat and total fat, to limit sodium intake by avoiding high sodium foods and not adding table salt, and to maintain adequate dietary potassium and calcium preferably from fresh fruits, vegetables, and low-fat dairy products.    stressed the importance of regular exercise  Injury prevention: Discussed safety belts, safety helmets, smoke detector, smoking near bedding or upholstery.   Dental health: Discussed importance of regular tooth brushing, flossing, and dental visits.   Follow up plan: NEXT PREVENTATIVE PHYSICAL DUE IN 1 YEAR. Return in about 6 months (around 12/13/2016) for Follow up BP.

## 2016-06-12 NOTE — Patient Instructions (Addendum)
 Health Maintenance, Male A healthy lifestyle and preventive care is important for your health and wellness. Ask your health care provider about what schedule of regular examinations is right for you. What should I know about weight and diet?  Eat a Healthy Diet  Eat plenty of vegetables, fruits, whole grains, low-fat dairy products, and lean protein.  Do not eat a lot of foods high in solid fats, added sugars, or salt. Maintain a Healthy Weight  Regular exercise can help you achieve or maintain a healthy weight. You should:  Do at least 150 minutes of exercise each week. The exercise should increase your heart rate and make you sweat (moderate-intensity exercise).  Do strength-training exercises at least twice a week. Watch Your Levels of Cholesterol and Blood Lipids  Have your blood tested for lipids and cholesterol every 5 years starting at 47 years of age. If you are at high risk for heart disease, you should start having your blood tested when you are 47 years old. You may need to have your cholesterol levels checked more often if:  Your lipid or cholesterol levels are high.  You are older than 47 years of age.  You are at high risk for heart disease. What should I know about cancer screening? Many types of cancers can be detected early and may often be prevented. Lung Cancer  You should be screened every year for lung cancer if:  You are a current smoker who has smoked for at least 30 years.  You are a former smoker who has quit within the past 15 years.  Talk to your health care provider about your screening options, when you should start screening, and how often you should be screened. Colorectal Cancer  Routine colorectal cancer screening usually begins at 47 years of age and should be repeated every 5-10 years until you are 47 years old. You may need to be screened more often if early forms of precancerous polyps or small growths are found. Your health care provider  may recommend screening at an earlier age if you have risk factors for colon cancer.  Your health care provider may recommend using home test kits to check for hidden blood in the stool.  A small camera at the end of a tube can be used to examine your colon (sigmoidoscopy or colonoscopy). This checks for the earliest forms of colorectal cancer. Prostate and Testicular Cancer  Depending on your age and overall health, your health care provider may do certain tests to screen for prostate and testicular cancer.  Talk to your health care provider about any symptoms or concerns you have about testicular or prostate cancer. Skin Cancer  Check your skin from head to toe regularly.  Tell your health care provider about any new moles or changes in moles, especially if:  There is a change in a mole's size, shape, or color.  You have a mole that is larger than a pencil eraser.  Always use sunscreen. Apply sunscreen liberally and repeat throughout the day.  Protect yourself by wearing long sleeves, pants, a wide-brimmed hat, and sunglasses when outside. What should I know about heart disease, diabetes, and high blood pressure?  If you are 18-39 years of age, have your blood pressure checked every 3-5 years. If you are 40 years of age or older, have your blood pressure checked every year. You should have your blood pressure measured twice-once when you are at a hospital or clinic, and once when you are not at   a hospital or clinic. Record the average of the two measurements. To check your blood pressure when you are not at a hospital or clinic, you can use:  An automated blood pressure machine at a pharmacy.  A home blood pressure monitor.  Talk to your health care provider about your target blood pressure.  If you are between 45-79 years old, ask your health care provider if you should take aspirin to prevent heart disease.  Have regular diabetes screenings by checking your fasting blood sugar  level.  If you are at a normal weight and have a low risk for diabetes, have this test once every three years after the age of 45.  If you are overweight and have a high risk for diabetes, consider being tested at a younger age or more often.  A one-time screening for abdominal aortic aneurysm (AAA) by ultrasound is recommended for men aged 65-75 years who are current or former smokers. What should I know about preventing infection? Hepatitis B  If you have a higher risk for hepatitis B, you should be screened for this virus. Talk with your health care provider to find out if you are at risk for hepatitis B infection. Hepatitis C  Blood testing is recommended for:  Everyone born from 1945 through 1965.  Anyone with known risk factors for hepatitis C. Sexually Transmitted Diseases (STDs)  You should be screened each year for STDs including gonorrhea and chlamydia if:  You are sexually active and are younger than 47 years of age.  You are older than 47 years of age and your health care provider tells you that you are at risk for this type of infection.  Your sexual activity has changed since you were last screened and you are at an increased risk for chlamydia or gonorrhea. Ask your health care provider if you are at risk.  Talk with your health care provider about whether you are at high risk of being infected with HIV. Your health care provider may recommend a prescription medicine to help prevent HIV infection. What else can I do?  Schedule regular health, dental, and eye exams.  Stay current with your vaccines (immunizations).  Do not use any tobacco products, such as cigarettes, chewing tobacco, and e-cigarettes. If you need help quitting, ask your health care provider.  Limit alcohol intake to no more than 2 drinks per day. One drink equals 12 ounces of beer, 5 ounces of wine, or 1 ounces of hard liquor.  Do not use street drugs.  Do not share needles.  Ask your health  care provider for help if you need support or information about quitting drugs.  Tell your health care provider if you often feel depressed.  Tell your health care provider if you have ever been abused or do not feel safe at home. This information is not intended to replace advice given to you by your health care provider. Make sure you discuss any questions you have with your health care provider. Document Released: 09/19/2007 Document Revised: 11/20/2015 Document Reviewed: 12/25/2014 Elsevier Interactive Patient Education  2017 [employer]  

## 2016-06-12 NOTE — Assessment & Plan Note (Signed)
Under good control. Continue current regimen. Continue to monitor. Recheck 6 months.  

## 2016-06-12 NOTE — Assessment & Plan Note (Signed)
Rechecking levels today. Call with any concerns. Await results.  

## 2016-06-13 LAB — CBC WITH DIFFERENTIAL/PLATELET
BASOS: 0 %
Basophils Absolute: 0 10*3/uL (ref 0.0–0.2)
EOS (ABSOLUTE): 0.1 10*3/uL (ref 0.0–0.4)
EOS: 2 %
HEMATOCRIT: 46.6 % (ref 37.5–51.0)
Hemoglobin: 15.7 g/dL (ref 13.0–17.7)
Immature Grans (Abs): 0 10*3/uL (ref 0.0–0.1)
Immature Granulocytes: 0 %
LYMPHS ABS: 1.5 10*3/uL (ref 0.7–3.1)
Lymphs: 27 %
MCH: 29 pg (ref 26.6–33.0)
MCHC: 33.7 g/dL (ref 31.5–35.7)
MCV: 86 fL (ref 79–97)
MONOS ABS: 0.5 10*3/uL (ref 0.1–0.9)
Monocytes: 9 %
NEUTROS ABS: 3.4 10*3/uL (ref 1.4–7.0)
NEUTROS PCT: 62 %
PLATELETS: 247 10*3/uL (ref 150–379)
RBC: 5.41 x10E6/uL (ref 4.14–5.80)
RDW: 13.7 % (ref 12.3–15.4)
WBC: 5.6 10*3/uL (ref 3.4–10.8)

## 2016-06-13 LAB — COMPREHENSIVE METABOLIC PANEL
A/G RATIO: 1.7 (ref 1.2–2.2)
ALBUMIN: 4.5 g/dL (ref 3.5–5.5)
ALK PHOS: 54 IU/L (ref 39–117)
ALT: 21 IU/L (ref 0–44)
AST: 18 IU/L (ref 0–40)
BILIRUBIN TOTAL: 0.9 mg/dL (ref 0.0–1.2)
BUN / CREAT RATIO: 17 (ref 9–20)
BUN: 17 mg/dL (ref 6–24)
CHLORIDE: 100 mmol/L (ref 96–106)
CO2: 27 mmol/L (ref 18–29)
Calcium: 9.6 mg/dL (ref 8.7–10.2)
Creatinine, Ser: 1.02 mg/dL (ref 0.76–1.27)
GFR calc Af Amer: 101 mL/min/{1.73_m2} (ref >59)
GFR calc non Af Amer: 88 mL/min/{1.73_m2} (ref >59)
GLOBULIN, TOTAL: 2.7 g/dL (ref 1.5–4.5)
Glucose: 110 mg/dL — ABNORMAL HIGH (ref 65–99)
Potassium: 4.6 mmol/L (ref 3.5–5.2)
SODIUM: 140 mmol/L (ref 134–144)
Total Protein: 7.2 g/dL (ref 6.0–8.5)

## 2016-06-13 LAB — LIPID PANEL W/O CHOL/HDL RATIO
CHOLESTEROL TOTAL: 245 mg/dL — AB (ref 100–199)
HDL: 41 mg/dL (ref >39)
LDL CALC: 164 mg/dL — AB (ref 0–99)
Triglycerides: 202 mg/dL — ABNORMAL HIGH (ref 0–149)
VLDL Cholesterol Cal: 40 mg/dL (ref 5–40)

## 2016-06-13 LAB — TSH: TSH: 0.669 u[IU]/mL (ref 0.450–4.500)

## 2016-06-15 ENCOUNTER — Encounter: Payer: Self-pay | Admitting: Family Medicine

## 2016-11-03 ENCOUNTER — Other Ambulatory Visit: Payer: Self-pay | Admitting: Family Medicine

## 2016-11-12 ENCOUNTER — Encounter: Payer: Self-pay | Admitting: Family Medicine

## 2016-11-12 ENCOUNTER — Ambulatory Visit (INDEPENDENT_AMBULATORY_CARE_PROVIDER_SITE_OTHER): Payer: 59 | Admitting: Family Medicine

## 2016-11-12 VITALS — BP 142/95 | HR 72 | Temp 98.3°F | Wt 199.0 lb

## 2016-11-12 DIAGNOSIS — R1084 Generalized abdominal pain: Secondary | ICD-10-CM | POA: Diagnosis not present

## 2016-11-12 MED ORDER — SUCRALFATE 1 G PO TABS: 1.0000 g | ORAL_TABLET | Freq: Three times a day (TID) | ORAL | 1 refills | Status: DC

## 2016-11-12 MED ORDER — ONDANSETRON 4 MG PO TBDP: 4.0000 mg | ORAL_TABLET | Freq: Three times a day (TID) | ORAL | 0 refills | Status: DC | PRN

## 2016-11-12 NOTE — Progress Notes (Signed)
BP (!) 142/95   Pulse 72   Temp 98.3 F (36.8 C)   Wt 199 lb (90.3 kg)   BMI 27.37 kg/m    Subjective:    Patient ID: Nathaniel Young, male    DOB: [birth year], 46 y.o.   MRN: [id number]  HPI: Nathaniel Young is a 46 y.o. male  Chief Complaint  Patient presents with  . Abdominal Pain    started Tues morning with vomiting and diarrhea. Now just mainly having stomach pain and cramps, Has been having alot of stomach issues like this over the last few months.    Patient presents with crampy generalized abdominal pain x 2 days with some intermittent diarrhea. Minimal nausea, no vomiting or melena. Denies fevers, chills, achs, sick contacts, new foods, recent travel.  Has not tried anything OTC for sxs. Notes he had a stomach virus about 3 months ago and seems to get these recurrences every few weeks now. Sxs seem to be resolving today, still just having the lingering cramping. Does have a hx of diverticulosis but no diverticulitis episodes, and also has had kidney stones in the past. States this does not feel similar.   Relevant past medical, surgical, family and social history reviewed and updated as indicated. Interim medical history since our last visit reviewed. Allergies and medications reviewed and updated.  Review of Systems  Constitutional: Negative.   HENT: Negative.   Respiratory: Negative.   Cardiovascular: Negative.   Gastrointestinal: Positive for abdominal pain and diarrhea.  Genitourinary: Negative.   Musculoskeletal: Negative.   Neurological: Negative.   Psychiatric/Behavioral: Negative.    Per HPI unless specifically indicated above     Objective:    BP (!) 142/95   Pulse 72   Temp 98.3 F (36.8 C)   Wt 199 lb (90.3 kg)   BMI 27.37 kg/m   Wt Readings from Last 3 Encounters:  11/12/16 199 lb (90.3 kg)  06/12/16 203 lb (92.1 kg)  10/29/15 204 lb (92.5 kg)    Physical Exam  Constitutional: He is oriented to person, place, and time. He appears  well-developed and well-nourished. No distress.  HENT:  Head: Atraumatic.  Eyes: Pupils are equal, round, and reactive to light. Conjunctivae are normal.  Neck: Normal range of motion. Neck supple.  Cardiovascular: Normal rate and normal heart sounds.   Pulmonary/Chest: Effort normal and breath sounds normal. No respiratory distress.  Abdominal: Soft. Bowel sounds are normal. He exhibits no mass. There is tenderness (mild soreness across abdomen). There is no guarding.  Musculoskeletal: Normal range of motion.  Neurological: He is alert and oriented to person, place, and time.  Skin: Skin is warm and dry.  Psychiatric: He has a normal mood and affect. His behavior is normal.  Nursing note and vitals reviewed.  Results for orders placed or performed in visit on 11/12/16  CBC with Differential/Platelet  Result Value Ref Range   WBC 9.6 3.4 - 10.8 x10E3/uL   RBC 4.43 4.14 - 5.80 x10E6/uL   Hemoglobin 13.7 13.0 - 17.7 g/dL   Hematocrit 40.1 37.5 - 51.0 %   MCV 91 79 - 97 fL   MCH 30.9 26.6 - 33.0 pg   MCHC 34.2 31.5 - 35.7 g/dL   RDW 13.1 12.3 - 15.4 %   Platelets 337 150 - 379 x10E3/uL   Neutrophils 47 Not Estab. %   Lymphs 37 Not Estab. %   Monocytes 5 Not Estab. %   Eos 11 Not Estab. %  Basos 0 Not Estab. %   Neutrophils Absolute 4.5 1.4 - 7.0 x10E3/uL   Lymphocytes Absolute 3.5 (H) 0.7 - 3.1 x10E3/uL   Monocytes Absolute 0.5 0.1 - 0.9 x10E3/uL   EOS (ABSOLUTE) 1.1 (H) 0.0 - 0.4 x10E3/uL   Basophils Absolute 0.0 0.0 - 0.2 x10E3/uL   Immature Granulocytes 0 Not Estab. %   Immature Grans (Abs) 0.0 0.0 - 0.1 x10E3/uL  Comprehensive metabolic panel  Result Value Ref Range   Glucose 90 65 - 99 mg/dL   BUN 10 6 - 24 mg/dL   Creatinine, Ser 0.64 (L) 0.76 - 1.27 mg/dL   GFR calc non Af Amer 117 >59 mL/min/1.73   GFR calc Af Amer 136 >59 mL/min/1.73   BUN/Creatinine Ratio 16 9 - 20   Sodium 138 134 - 144 mmol/L   Potassium 4.3 3.5 - 5.2 mmol/L   Chloride 102 96 - 106 mmol/L    CO2 22 20 - 29 mmol/L   Calcium 9.4 8.7 - 10.2 mg/dL   Total Protein 7.2 6.0 - 8.5 g/dL   Albumin 4.5 3.5 - 5.5 g/dL   Globulin, Total 2.7 1.5 - 4.5 g/dL   Albumin/Globulin Ratio 1.7 1.2 - 2.2   Bilirubin Total 0.5 0.0 - 1.2 mg/dL   Alkaline Phosphatase 83 39 - 117 IU/L   AST 28 0 - 40 IU/L   ALT 37 0 - 44 IU/L      Assessment & Plan:   Problem List Items Addressed This Visit    None    Visit Diagnoses    Generalized abdominal pain    -  Primary   Suspect recurrent viral infections, supportive care, zofran and carafate prn. Vitals and exam fairly benign. Await basic labs. Return precautions reviewed   Relevant Orders   CBC with Differential/Platelet (Completed)   Comprehensive metabolic panel (Completed)       Follow up plan: Return if symptoms worsen or fail to improve.

## 2016-11-13 LAB — COMPREHENSIVE METABOLIC PANEL
A/G RATIO: 1.7 (ref 1.2–2.2)
ALK PHOS: 83 IU/L (ref 39–117)
ALT: 37 IU/L (ref 0–44)
AST: 28 IU/L (ref 0–40)
Albumin: 4.5 g/dL (ref 3.5–5.5)
BUN/Creatinine Ratio: 16 (ref 9–20)
BUN: 10 mg/dL (ref 6–24)
Bilirubin Total: 0.5 mg/dL (ref 0.0–1.2)
CHLORIDE: 102 mmol/L (ref 96–106)
CO2: 22 mmol/L (ref 20–29)
Calcium: 9.4 mg/dL (ref 8.7–10.2)
Creatinine, Ser: 0.64 mg/dL — ABNORMAL LOW (ref 0.76–1.27)
GFR calc Af Amer: 136 mL/min/{1.73_m2} (ref >59)
GFR calc non Af Amer: 117 mL/min/{1.73_m2} (ref >59)
GLOBULIN, TOTAL: 2.7 g/dL (ref 1.5–4.5)
Glucose: 90 mg/dL (ref 65–99)
POTASSIUM: 4.3 mmol/L (ref 3.5–5.2)
SODIUM: 138 mmol/L (ref 134–144)
Total Protein: 7.2 g/dL (ref 6.0–8.5)

## 2016-11-13 LAB — CBC WITH DIFFERENTIAL/PLATELET
BASOS: 0 %
Basophils Absolute: 0 10*3/uL (ref 0.0–0.2)
EOS (ABSOLUTE): 1.1 10*3/uL — AB (ref 0.0–0.4)
EOS: 11 %
HEMATOCRIT: 40.1 % (ref 37.5–51.0)
Hemoglobin: 13.7 g/dL (ref 13.0–17.7)
Immature Grans (Abs): 0 10*3/uL (ref 0.0–0.1)
Immature Granulocytes: 0 %
LYMPHS ABS: 3.5 10*3/uL — AB (ref 0.7–3.1)
Lymphs: 37 %
MCH: 30.9 pg (ref 26.6–33.0)
MCHC: 34.2 g/dL (ref 31.5–35.7)
MCV: 91 fL (ref 79–97)
MONOS ABS: 0.5 10*3/uL (ref 0.1–0.9)
Monocytes: 5 %
Neutrophils Absolute: 4.5 10*3/uL (ref 1.4–7.0)
Neutrophils: 47 %
PLATELETS: 337 10*3/uL (ref 150–379)
RBC: 4.43 x10E6/uL (ref 4.14–5.80)
RDW: 13.1 % (ref 12.3–15.4)
WBC: 9.6 10*3/uL (ref 3.4–10.8)

## 2016-11-15 NOTE — Patient Instructions (Signed)
Follow up as needed

## 2016-12-16 ENCOUNTER — Ambulatory Visit (INDEPENDENT_AMBULATORY_CARE_PROVIDER_SITE_OTHER): Payer: 59 | Admitting: Family Medicine

## 2016-12-16 ENCOUNTER — Encounter: Payer: Self-pay | Admitting: Family Medicine

## 2016-12-16 VITALS — BP 132/91 | HR 72 | Temp 98.7°F | Wt 203.4 lb

## 2016-12-16 DIAGNOSIS — K625 Hemorrhage of anus and rectum: Secondary | ICD-10-CM

## 2016-12-16 DIAGNOSIS — I10 Essential (primary) hypertension: Secondary | ICD-10-CM

## 2016-12-16 MED ORDER — LISINOPRIL 5 MG PO TABS: 5.0000 mg | ORAL_TABLET | Freq: Every day | ORAL | 3 refills | Status: DC

## 2016-12-16 NOTE — Progress Notes (Signed)
BP (!) 132/91 (BP Location: Left Arm, Patient Position: Sitting, Cuff Size: Large)   Pulse 72   Temp 98.7 F (37.1 C)   Wt 203 lb 6 oz (92.3 kg)   SpO2 99%   BMI 27.97 kg/m    Subjective:    Patient ID: Nathaniel Young, male    DOB: Oct 17, 1970, 46 y.o.   MRN: [id number]  HPI: Nathaniel Young is a 46 y.o. male  Chief Complaint  Patient presents with  . Hypertension   HYPERTENSION- has been out of his medicine for about 3 weeks  Hypertension status: stable  Satisfied with current treatment? yes Duration of hypertension: chronic BP monitoring frequency:  not checking BP medication side effects:  no Medication compliance: good compliance Previous BP meds: lisinopril Aspirin: no Recurrent headaches: no Visual changes: no Palpitations: no Dyspnea: no Chest pain: no Lower extremity edema: no Dizzy/lightheaded: no  RECTAL BLEEDING Duration: about a year Bright red rectal bleeding: yes  Amount of blood: moderate  Frequency: with hard stools or upset stomach Melena: no  Spotting on toilet tissue: no  Anal fullness: no  Perianal pain: no  Severity: chronic Perianal irritation/itching: no  Constipation: yes  Chronic straining/valsava:  yes  Anal trauma/intercourse: no  Hemorrhoids: yes  Previous colonoscopy: no   Relevant past medical, surgical, family and social history reviewed and updated as indicated. Interim medical history since our last visit reviewed. Allergies and medications reviewed and updated.  Review of Systems  Constitutional: Negative.   Respiratory: Negative.   Cardiovascular: Negative.   Psychiatric/Behavioral: Negative.     Per HPI unless specifically indicated above     Objective:    BP (!) 132/91 (BP Location: Left Arm, Patient Position: Sitting, Cuff Size: Large)   Pulse 72   Temp 98.7 F (37.1 C)   Wt 203 lb 6 oz (92.3 kg)   SpO2 99%   BMI 27.97 kg/m   Wt Readings from Last 3 Encounters:  12/16/16 203 lb 6 oz (92.3 kg)    11/12/16 199 lb (90.3 kg)  06/12/16 203 lb (92.1 kg)    Physical Exam  Constitutional: He is oriented to person, place, and time. He appears well-developed and well-nourished. No distress.  HENT:  Head: Normocephalic and atraumatic.  Right Ear: Hearing normal.  Left Ear: Hearing normal.  Nose: Nose normal.  Eyes: Conjunctivae and lids are normal. Right eye exhibits no discharge. Left eye exhibits no discharge. No scleral icterus.  Cardiovascular: Normal rate, regular rhythm, normal heart sounds and intact distal pulses.  Exam reveals no gallop and no friction rub.   No murmur heard. Pulmonary/Chest: Effort normal and breath sounds normal. No respiratory distress. He has no wheezes. He has no rales. He exhibits no tenderness.  Musculoskeletal: Normal range of motion.  Neurological: He is alert and oriented to person, place, and time.  Skin: Skin is warm, dry and intact. No rash noted. He is not diaphoretic. No erythema. No pallor.  Psychiatric: He has a normal mood and affect. His speech is normal and behavior is normal. Judgment and thought content normal. Cognition and memory are normal.    Results for orders placed or performed in visit on 11/12/16  CBC with Differential/Platelet  Result Value Ref Range   WBC 9.6 3.4 - 10.8 x10E3/uL   RBC 4.43 4.14 - 5.80 x10E6/uL   Hemoglobin 13.7 13.0 - 17.7 g/dL   Hematocrit 40.1 37.5 - 51.0 %   MCV 91 79 - 97 fL   MCH  30.9 26.6 - 33.0 pg   MCHC 34.2 31.5 - 35.7 g/dL   RDW 13.1 12.3 - 15.4 %   Platelets 337 150 - 379 x10E3/uL   Neutrophils 47 Not Estab. %   Lymphs 37 Not Estab. %   Monocytes 5 Not Estab. %   Eos 11 Not Estab. %   Basos 0 Not Estab. %   Neutrophils Absolute 4.5 1.4 - 7.0 x10E3/uL   Lymphocytes Absolute 3.5 (H) 0.7 - 3.1 x10E3/uL   Monocytes Absolute 0.5 0.1 - 0.9 x10E3/uL   EOS (ABSOLUTE) 1.1 (H) 0.0 - 0.4 x10E3/uL   Basophils Absolute 0.0 0.0 - 0.2 x10E3/uL   Immature Granulocytes 0 Not Estab. %   Immature Grans  (Abs) 0.0 0.0 - 0.1 x10E3/uL  Comprehensive metabolic panel  Result Value Ref Range   Glucose 90 65 - 99 mg/dL   BUN 10 6 - 24 mg/dL   Creatinine, Ser 0.64 (L) 0.76 - 1.27 mg/dL   GFR calc non Af Amer 117 >59 mL/min/1.73   GFR calc Af Amer 136 >59 mL/min/1.73   BUN/Creatinine Ratio 16 9 - 20   Sodium 138 134 - 144 mmol/L   Potassium 4.3 3.5 - 5.2 mmol/L   Chloride 102 96 - 106 mmol/L   CO2 22 20 - 29 mmol/L   Calcium 9.4 8.7 - 10.2 mg/dL   Total Protein 7.2 6.0 - 8.5 g/dL   Albumin 4.5 3.5 - 5.5 g/dL   Globulin, Total 2.7 1.5 - 4.5 g/dL   Albumin/Globulin Ratio 1.7 1.2 - 2.2   Bilirubin Total 0.5 0.0 - 1.2 mg/dL   Alkaline Phosphatase 83 39 - 117 IU/L   AST 28 0 - 40 IU/L   ALT 37 0 - 44 IU/L      Assessment & Plan:   Problem List Items Addressed This Visit      Cardiovascular and Mediastinum   Hypertension - Primary    Stable. Restart medicine. Recheck at physical in March. Call with any concerns.       Relevant Medications   lisinopril (PRINIVIL,ZESTRIL) 5 MG tablet    Other Visit Diagnoses    Bright red blood per rectum       Likely hemorrhoids. Will refer to GI to make sure. Referral generated today.   Relevant Orders   Ambulatory referral to Gastroenterology       Follow up plan: Return in about 6 months (around 06/15/2017) for Physical.

## 2016-12-16 NOTE — Assessment & Plan Note (Signed)
Stable. Restart medicine. Recheck at physical in March. Call with any concerns.

## 2016-12-18 ENCOUNTER — Encounter: Payer: Self-pay | Admitting: Gastroenterology

## 2017-01-19 ENCOUNTER — Ambulatory Visit (INDEPENDENT_AMBULATORY_CARE_PROVIDER_SITE_OTHER): Payer: 59 | Admitting: Gastroenterology

## 2017-01-19 ENCOUNTER — Other Ambulatory Visit: Payer: Self-pay

## 2017-01-19 VITALS — BP 139/78 | HR 77 | Temp 98.3°F | Ht 71.5 in | Wt 204.8 lb

## 2017-01-19 DIAGNOSIS — K625 Hemorrhage of anus and rectum: Secondary | ICD-10-CM | POA: Diagnosis not present

## 2017-01-19 DIAGNOSIS — K649 Unspecified hemorrhoids: Secondary | ICD-10-CM

## 2017-01-19 MED ORDER — POLYETHYLENE GLYCOL 3350 17 GM/SCOOP PO POWD: 1.0000 | Freq: Every day | ORAL | 3 refills | Status: DC

## 2017-01-19 NOTE — Addendum Note (Signed)
Addended by: Peggye Ley on: 01/19/2017 03:42 PM   Modules accepted: Orders, SmartSet

## 2017-01-19 NOTE — Progress Notes (Signed)
Jonathon Bellows MD, MRCP(U.K) [address]  [place]  [place], [place] [address]  Main: [phone]  Fax: [phone]   Gastroenterology Consultation  Referring Provider:     Valerie Roys, DO Primary Care Physician:  Valerie Roys, DO Primary Gastroenterologist:  Dr. Jonathon Bellows  Reason for Consultation:     Blood in stool         HPI:   Nathaniel Young is a 46 y.o. y/o male referred for consultation & management  by Dr. Wynetta Emery, Megan P, DO. Marland Kitchen   He says that about 1 year back , blood in the toilet bowel. On and off, more when he is constipated or an "upset stomach", could go a month or so with no issues, and then can recur. Stable . Denies any family history of colon cancer or polyps.  No change in the shape of his stool. Has 2 bowel movements a day . He does have perianal itching.   Past Medical History:  Diagnosis Date  . Diverticulosis   . Hyperlipidemia   . Hypertension   . Nephrolithiasis   . Urticaria     No past surgical history on file.  Prior to Admission medications   Medication Sig Start Date End Date Taking? Authorizing Provider  lisinopril (PRINIVIL,ZESTRIL) 5 MG tablet Take 1 tablet (5 mg total) by mouth daily. 12/16/16  Yes Johnson, Megan P, DO  ondansetron (ZOFRAN-ODT) 4 MG disintegrating tablet Take 4 mg by mouth every 8 (eight) hours as needed. 11/12/16   [provider]  sucralfate (CARAFATE) 1 g tablet TAKE 1 TABLET (1 G TOTAL) BY MOUTH 4 (FOUR) TIMES DAILY - WITH MEALS AND AT BEDTIME. 11/12/16   [provider]    Family History  Problem Relation Age of Onset  . Hypertension Mother   . Cancer Father        throat  . COPD Father   . AAA (abdominal aortic aneurysm) Father   . Aneurysm Father        brain     Social History  Substance Use Topics  . Smoking status: Never Smoker  . Smokeless tobacco: Never Used  . Alcohol use No    Allergies as of 01/19/2017 - Review Complete 01/19/2017  Allergen Reaction Noted  .  Robaxin [methocarbamol] Rash 07/17/2014    Review of Systems:    All systems reviewed and negative except where noted in HPI.   Physical Exam:  BP 139/78   Pulse 77   Temp 98.3 F (36.8 C) (Oral)   Ht 5' 11.5" (1.816 m)   Wt 204 lb 12.8 oz (92.9 kg)   BMI 28.17 kg/m  No LMP for male patient. Psych:  Alert and cooperative. Normal mood and affect. General:   Alert,  Well-developed, well-nourished, pleasant and cooperative in NAD Head:  Normocephalic and atraumatic. Eyes:  Sclera clear, no icterus.   Conjunctiva pink. Ears:  Normal auditory acuity. Nose:  No deformity, discharge, or lesions. Mouth:  No deformity or lesions,oropharynx pink & moist. Neck:  Supple; no masses or thyromegaly. Lungs:  Respirations even and unlabored.  Clear throughout to auscultation.   No wheezes, crackles, or rhonchi. No acute distress. Heart:  Regular rate and rhythm; no murmurs, clicks, rubs, or gallops. Abdomen:  Normal bowel sounds.  No bruits.  Soft, non-tender and non-distended without masses, hepatosplenomegaly or hernias noted.  No guarding or rebound tenderness.    Neurologic:  Alert and oriented x3;  grossly normal neurologically. Skin:  Intact without significant lesions or rashes. No jaundice. Lymph Nodes:  No significant cervical adenopathy. Psych:  Alert and cooperative. Normal mood and affect.  Imaging Studies: No results found.  Assessment and Plan:   Nathaniel Young is a 46 y.o. y/o male has been referred for rectal bleeding .    1. Diagnostic colonoscopy  2. Hemorroids patient information.  3. Daily miralax for constipation.   I have discussed alternative options, risks & benefits,  which include, but are not limited to, bleeding, infection, perforation,respiratory complication & drug reaction.  The patient agrees with this plan & written consent will be obtained.     Follow up PRN  Dr Jonathon Bellows MD,MRCP(U.K)

## 2017-01-26 ENCOUNTER — Encounter: Payer: Self-pay | Admitting: *Deleted

## 2017-01-27 ENCOUNTER — Encounter
Admission: RE | Disposition: A | Discharge status: Home / Self Care | Payer: Self-pay | Source: Ambulatory Visit | Attending: Gastroenterology

## 2017-01-27 ENCOUNTER — Ambulatory Visit: Payer: 59 | Admitting: Anesthesiology

## 2017-01-27 ENCOUNTER — Ambulatory Visit
Admission: RE | Admit: 2017-01-27 | Discharge: 2017-01-27 | Disposition: A | Discharge status: Home / Self Care | Payer: 59 | Source: Ambulatory Visit | Attending: Gastroenterology | Admitting: Gastroenterology

## 2017-01-27 ENCOUNTER — Encounter: Payer: Self-pay | Admitting: *Deleted

## 2017-01-27 DIAGNOSIS — Z79899 Other long term (current) drug therapy: Secondary | ICD-10-CM | POA: Diagnosis not present

## 2017-01-27 DIAGNOSIS — D122 Benign neoplasm of ascending colon: Secondary | ICD-10-CM

## 2017-01-27 DIAGNOSIS — K640 First degree hemorrhoids: Secondary | ICD-10-CM

## 2017-01-27 DIAGNOSIS — E785 Hyperlipidemia, unspecified: Secondary | ICD-10-CM | POA: Insufficient documentation

## 2017-01-27 DIAGNOSIS — K5730 Diverticulosis of large intestine without perforation or abscess without bleeding: Secondary | ICD-10-CM

## 2017-01-27 DIAGNOSIS — Z888 Allergy status to other drugs, medicaments and biological substances status: Secondary | ICD-10-CM | POA: Diagnosis not present

## 2017-01-27 DIAGNOSIS — K625 Hemorrhage of anus and rectum: Secondary | ICD-10-CM

## 2017-01-27 DIAGNOSIS — I10 Essential (primary) hypertension: Secondary | ICD-10-CM | POA: Insufficient documentation

## 2017-01-27 HISTORY — PX: COLONOSCOPY WITH PROPOFOL: SHX5780

## 2017-01-27 SURGERY — COLONOSCOPY WITH PROPOFOL
Anesthesia: General

## 2017-01-27 MED ORDER — LIDOCAINE HCL (PF) 2 % IJ SOLN
INTRAMUSCULAR | Status: AC
  Filled 2017-01-27: qty 10

## 2017-01-27 MED ORDER — PROPOFOL 10 MG/ML IV BOLUS
INTRAVENOUS | Status: DC | PRN
  Administered 2017-01-27: 70 mg via INTRAVENOUS

## 2017-01-27 MED ORDER — FENTANYL CITRATE (PF) 100 MCG/2ML IJ SOLN
INTRAMUSCULAR | Status: AC
Start: 2017-01-27 — End: ?
  Filled 2017-01-27: qty 2

## 2017-01-27 MED ORDER — MIDAZOLAM HCL 2 MG/2ML IJ SOLN
INTRAMUSCULAR | Status: AC
  Filled 2017-01-27: qty 2

## 2017-01-27 MED ORDER — SODIUM CHLORIDE 0.9 % IV SOLN
INTRAVENOUS | Status: DC
  Administered 2017-01-27: 13:00:00 via INTRAVENOUS

## 2017-01-27 MED ORDER — PROPOFOL 10 MG/ML IV BOLUS
INTRAVENOUS | Status: AC
  Filled 2017-01-27: qty 20

## 2017-01-27 MED ORDER — PROPOFOL 500 MG/50ML IV EMUL
INTRAVENOUS | Status: AC
  Filled 2017-01-27: qty 50

## 2017-01-27 MED ORDER — PROPOFOL 500 MG/50ML IV EMUL
INTRAVENOUS | Status: DC | PRN
  Administered 2017-01-27: 120 ug/kg/min via INTRAVENOUS

## 2017-01-27 MED ORDER — MIDAZOLAM HCL 2 MG/2ML IJ SOLN
INTRAMUSCULAR | Status: DC | PRN
  Administered 2017-01-27: 1 mg via INTRAVENOUS

## 2017-01-27 NOTE — Anesthesia Preprocedure Evaluation (Signed)
Anesthesia Evaluation  Patient identified by MRN, date of birth, ID band Patient awake    Reviewed: Allergy & Precautions, NPO status , Patient's Chart, lab work & pertinent test results  Airway Mallampati: II       Dental  (+) Teeth Intact   Pulmonary neg pulmonary ROS,    breath sounds clear to auscultation       Cardiovascular hypertension, Pt. on medications  Rhythm:Regular Rate:Normal     Neuro/Psych negative neurological ROS  negative psych ROS   GI/Hepatic negative GI ROS, Neg liver ROS,   Endo/Other  negative endocrine ROS  Renal/GU   negative genitourinary   Musculoskeletal negative musculoskeletal ROS (+)   Abdominal Normal abdominal exam  (+)   Peds negative pediatric ROS (+)  Hematology negative hematology ROS (+)   Anesthesia Other Findings   Reproductive/Obstetrics                             Anesthesia Physical Anesthesia Plan  ASA: I  Anesthesia Plan: General   Post-op Pain Management:    Induction: Intravenous  PONV Risk Score and Plan:   Airway Management Planned: Natural Airway and Nasal Cannula  Additional Equipment:   Intra-op Plan:   Post-operative Plan:   Informed Consent: I have reviewed the patients History and Physical, chart, labs and discussed the procedure including the risks, benefits and alternatives for the proposed anesthesia with the patient or authorized representative who has indicated his/her understanding and acceptance.     Plan Discussed with: CRNA  Anesthesia Plan Comments:         Anesthesia Quick Evaluation

## 2017-01-27 NOTE — Anesthesia Postprocedure Evaluation (Signed)
Anesthesia Post Note  Patient: Nathaniel Young  Procedure(s) Performed: COLONOSCOPY WITH PROPOFOL (N/A )  Patient location during evaluation: PACU Anesthesia Type: General Level of consciousness: awake Pain management: pain level controlled Vital Signs Assessment: post-procedure vital signs reviewed and stable Respiratory status: spontaneous breathing Cardiovascular status: stable Anesthetic complications: no     Last Vitals:  Vitals:   01/27/17 1419 01/27/17 1429  BP: 106/76 109/77  Pulse: 78 63  Resp: 15 14  Temp: (!) 36.2 C   SpO2: 99% 100%    Last Pain:  Vitals:   01/27/17 1419  TempSrc: Tympanic                 VAN STAVEREN,Kida Digiulio

## 2017-01-27 NOTE — H&P (Signed)
  Jonathon Bellows MD [address], [place], [place] [address] Phone: [phone] Fax : [phone]  Primary Care Physician:  Valerie Roys, DO Primary Gastroenterologist:  Dr. Jonathon Bellows   Pre-Procedure History & Physical: HPI:  Nathaniel Young is a 46 y.o. male is here for an colonoscopy.   Past Medical History:  Diagnosis Date  . Diverticulosis   . Hyperlipidemia   . Hypertension   . Nephrolithiasis   . Urticaria     History reviewed. No pertinent surgical history.  Prior to Admission medications   Medication Sig Start Date End Date Taking? Authorizing Provider  lisinopril (PRINIVIL,ZESTRIL) 5 MG tablet Take 1 tablet (5 mg total) by mouth daily. 12/16/16   Johnson, Megan P, DO  ondansetron (ZOFRAN-ODT) 4 MG disintegrating tablet Take 4 mg by mouth every 8 (eight) hours as needed. 11/12/16   [provider]  polyethylene glycol powder (GLYCOLAX/MIRALAX) powder Take 255 g by mouth daily. 01/19/17   Jonathon Bellows, MD  sucralfate (CARAFATE) 1 g tablet TAKE 1 TABLET (1 G TOTAL) BY MOUTH 4 (FOUR) TIMES DAILY - WITH MEALS AND AT BEDTIME. 11/12/16   [provider]    Allergies as of 01/20/2017 - Review Complete 01/19/2017  Allergen Reaction Noted  . Robaxin [methocarbamol] Rash 07/17/2014    Family History  Problem Relation Age of Onset  . Hypertension Mother   . Cancer Father        throat  . COPD Father   . AAA (abdominal aortic aneurysm) Father   . Aneurysm Father        brain    Social History   Social History  . Marital status: Married    Spouse name: N/A  . Number of children: N/A  . Years of education: N/A   Occupational History  . Not on file.   Social History Main Topics  . Smoking status: Never Smoker  . Smokeless tobacco: Never Used  . Alcohol use No  . Drug use: No  . Sexual activity: Yes   Other Topics Concern  . Not on file   Social History Narrative  . No narrative on file    Review of Systems: See HPI, otherwise  negative ROS  Physical Exam: There were no vitals taken for this visit. General:   Alert,  pleasant and cooperative in NAD Head:  Normocephalic and atraumatic. Neck:  Supple; no masses or thyromegaly. Lungs:  Clear throughout to auscultation.    Heart:  Regular rate and rhythm. Abdomen:  Soft, nontender and nondistended. Normal bowel sounds, without guarding, and without rebound.   Neurologic:  Alert and  oriented x4;  grossly normal neurologically.  Impression/Plan: Nathaniel Young is here for an colonoscopy to be performed for rectal bleeding   Risks, benefits, limitations, and alternatives regarding  colonoscopy have been reviewed with the patient.  Questions have been answered.  All parties agreeable.   Jonathon Bellows, MD  01/27/2017, 12:57 PM

## 2017-01-27 NOTE — Anesthesia Post-op Follow-up Note (Signed)
Anesthesia QCDR form completed.        

## 2017-01-27 NOTE — Op Note (Signed)
[facility] Patient Name: Nathaniel Young Procedure Date: 01/27/2017 1:44 PM MRN: [id number] Account #: [id number] Date of Birth: 12-28-1970 Admit Type: Outpatient Age: 46 Room: [facility] ENDO ROOM 1 Gender: Male Note Status: Finalized Procedure:            Colonoscopy Indications:          Rectal bleeding Providers:            Jonathon Bellows MD, MD Referring MD:         Valerie Roys (Referring MD) Medicines:            Monitored Anesthesia Care Complications:        No immediate complications. Procedure:            Pre-Anesthesia Assessment:                       - ASA Grade Assessment: I - A normal, healthy patient.                       After obtaining informed consent, the colonoscope was                        passed under direct vision. Throughout the procedure,                        the patient's blood pressure, pulse, and oxygen                        saturations were monitored continuously. The                        Colonoscope was introduced through the anus and                        advanced to the the cecum, identified by appendiceal                        orifice and ileocecal valve. The colonoscopy was                        performed with ease. The patient tolerated the                        procedure well. The quality of the bowel preparation                        was excellent. Findings:      The perianal and digital rectal examinations were normal.      Two sessile polyps were found in the ascending colon. The polyps were 3       to 4 mm in size. These polyps were removed with a cold biopsy forceps.       Resection and retrieval were complete.      Non-bleeding internal hemorrhoids were found during retroflexion. The       hemorrhoids were medium-sized and Grade I (internal hemorrhoids that do       not prolapse).      The exam was otherwise without abnormality.      A few small-mouthed diverticula were found in the sigmoid  colon. Impression:           - Two  3 to 4 mm polyps in the ascending colon, removed                        with a cold biopsy forceps. Resected and retrieved.                       - Non-bleeding internal hemorrhoids.                       - The examination was otherwise normal. Recommendation:       - Discharge patient to home (with escort).                       - Advance diet as tolerated.                       - Continue present medications.                       - Await pathology results.                       - Repeat colonoscopy in 5-10 years for surveillance                        based on pathology results. Procedure Code(s):    --- Professional ---                       [phone], Colonoscopy, flexible; with biopsy, single or                        multiple Diagnosis Code(s):    --- Professional ---                       D12.2, Benign neoplasm of ascending colon                       K64.0, First degree hemorrhoids                       K62.5, Hemorrhage of anus and rectum CPT copyright 2016 American Medical Association. All rights reserved. The codes documented in this report are preliminary and upon coder review may  be revised to meet current compliance requirements. Jonathon Bellows, MD Jonathon Bellows MD, MD 01/27/2017 2:17:22 PM This report has been signed electronically. Number of Addenda: 0 Note Initiated On: 01/27/2017 1:44 PM Scope Withdrawal Time: 0 hours 19 minutes 34 seconds  Total Procedure Duration: 0 hours 24 minutes 30 seconds       [facility]

## 2017-01-27 NOTE — Transfer of Care (Signed)
Immediate Anesthesia Transfer of Care Note  Patient: Balinda Quails Vilchis  Procedure(s) Performed: COLONOSCOPY WITH PROPOFOL (N/A )  Patient Location: PACU  Anesthesia Type:General  Level of Consciousness: sedated  Airway & Oxygen Therapy: Patient Spontanous Breathing and Patient connected to nasal cannula oxygen  Post-op Assessment: Report given to RN and Post -op Vital signs reviewed and stable  Post vital signs: Reviewed and stable  Last Vitals:  Vitals:   01/27/17 1308 01/27/17 1419  BP: (!) 136/92 106/76  Pulse: 65 78  Resp: 16 15  Temp: 36.7 C (!) 36.2 C  SpO2: 100% 99%    Last Pain:  Vitals:   01/27/17 1419  TempSrc: Tympanic         Complications: No apparent anesthesia complications

## 2017-01-29 ENCOUNTER — Encounter: Payer: Self-pay | Admitting: Gastroenterology

## 2017-01-29 LAB — SURGICAL PATHOLOGY

## 2017-01-31 ENCOUNTER — Encounter: Payer: Self-pay | Admitting: Gastroenterology

## 2017-02-15 ENCOUNTER — Ambulatory Visit (INDEPENDENT_AMBULATORY_CARE_PROVIDER_SITE_OTHER): Payer: 59 | Admitting: Gastroenterology

## 2017-02-15 ENCOUNTER — Encounter: Payer: Self-pay | Admitting: Gastroenterology

## 2017-02-15 VITALS — BP 126/79 | HR 61 | Temp 98.3°F | Ht 73.0 in | Wt 211.6 lb

## 2017-02-15 DIAGNOSIS — K644 Residual hemorrhoidal skin tags: Secondary | ICD-10-CM | POA: Insufficient documentation

## 2017-02-15 NOTE — Progress Notes (Signed)
Rubber band ligation for symptomatic hemorrhoids was performed.  Please refer to the scanned visit note under media.  Cephas Darby, MD [address]  [place]  [place], [place] [address]  Main: [phone]  Fax: [phone] Pager: [phone]

## 2017-03-08 ENCOUNTER — Encounter: Payer: Self-pay | Admitting: Gastroenterology

## 2017-03-08 ENCOUNTER — Ambulatory Visit (INDEPENDENT_AMBULATORY_CARE_PROVIDER_SITE_OTHER): Payer: 59 | Admitting: Gastroenterology

## 2017-03-08 VITALS — BP 124/79 | HR 71 | Ht 73.0 in | Wt 209.0 lb

## 2017-03-08 DIAGNOSIS — K648 Other hemorrhoids: Secondary | ICD-10-CM | POA: Diagnosis not present

## 2017-03-08 DIAGNOSIS — K625 Hemorrhage of anus and rectum: Secondary | ICD-10-CM | POA: Diagnosis not present

## 2017-03-08 NOTE — Progress Notes (Signed)
PROCEDURE NOTE: The patient presents with symptomatic grade 2 hemorrhoids, unresponsive to maximal medical therapy, requesting rubber band ligation of his/her hemorrhoidal disease.  All risks, benefits and alternative forms of therapy were described and informed consent was obtained.   The decision was made to band the RP internal hemorrhoid, and the [place] was used to perform band ligation without complication.  Digital anorectal examination was then performed to assure proper positioning of the band, and to adjust the banded tissue as required.  The patient was discharged home without pain or other issues.  Dietary and behavioral recommendations were given and (if necessary - prescriptions were given), along with follow-up instructions.  The patient will return 6 weeks for follow-up and possible additional banding as required.  No complications were encountered and the patient tolerated the procedure well.  Cephas Darby, MD [address]  [place]  [place], [place] [address]  Main: [phone]  Fax: [phone] Pager: [phone]

## 2017-04-23 ENCOUNTER — Ambulatory Visit: Payer: 59 | Admitting: Gastroenterology

## 2017-04-23 ENCOUNTER — Other Ambulatory Visit: Payer: Self-pay

## 2017-04-23 VITALS — BP 155/101 | HR 65 | Temp 98.3°F | Ht 72.0 in | Wt 204.0 lb

## 2017-04-23 DIAGNOSIS — K641 Second degree hemorrhoids: Secondary | ICD-10-CM | POA: Diagnosis not present

## 2017-04-23 MED ORDER — NONFORMULARY OR COMPOUNDED ITEM: 0 refills | Status: DC

## 2017-04-23 NOTE — Progress Notes (Signed)
PROCEDURE NOTE: The patient presents with symptomatic grade 2 hemorrhoids, unresponsive to maximal medical therapy, requesting rubber band ligation of his/her hemorrhoidal disease.  All risks, benefits and alternative forms of therapy were described and informed consent was obtained.  The decision was made to band the RA internal hemorrhoid, and the [place] was used to perform band ligation without complication.  Digital anorectal examination was then performed to assure proper positioning of the band, and to adjust the banded tissue as required.  The patient was discharged home without pain or other issues.  Dietary and behavioral recommendations were given and (if necessary - prescriptions were given), along with follow-up instructions.  The patient will return as needed for follow-up and possible additional banding as required.  He is previously treated for LL and RP hemorrhoids  No complications were encountered and the patient tolerated the procedure well.  Cephas Darby, MD [address]  [place]  [place], [place] [address]  Main: [phone]  Fax: [phone] Pager: [phone]

## 2017-05-19 ENCOUNTER — Encounter: Payer: Self-pay | Admitting: Family Medicine

## 2017-05-19 ENCOUNTER — Ambulatory Visit: Payer: 59 | Admitting: Family Medicine

## 2017-05-19 DIAGNOSIS — G44229 Chronic tension-type headache, not intractable: Secondary | ICD-10-CM | POA: Insufficient documentation

## 2017-05-19 DIAGNOSIS — I10 Essential (primary) hypertension: Secondary | ICD-10-CM

## 2017-05-19 MED ORDER — MELOXICAM 15 MG PO TABS: 15.0000 mg | ORAL_TABLET | Freq: Every day | ORAL | 3 refills | Status: DC

## 2017-05-19 MED ORDER — LISINOPRIL 10 MG PO TABS: 10.0000 mg | ORAL_TABLET | Freq: Every day | ORAL | 3 refills | Status: DC

## 2017-05-19 MED ORDER — CYCLOBENZAPRINE HCL 5 MG PO TABS: 5.0000 mg | ORAL_TABLET | Freq: Three times a day (TID) | ORAL | 1 refills | Status: DC | PRN

## 2017-05-19 NOTE — Progress Notes (Signed)
   BP (!) 179/104   Pulse 98   Wt 203 lb (92.1 kg)   SpO2 98%   BMI 27.53 kg/m    Subjective:    Patient ID: Nathaniel Young, male    DOB: 1971/03/25, 47 y.o.   MRN: [id number]  HPI: Nathaniel Young is a 47 y.o. male  Chief Complaint  Patient presents with  . Headache  Patient follow-up blood pressure and headaches.  Patient's headaches more posterior radiating around to his eye through his scalp area both left and right side seems to go back and forth. Patient under a great deal of stress. Also  concurrent with this time blood pressure has noted to be markedly elevated also.  Patient has had lisinopril 10 mg in the past which was too strong decreased to 5 mg and blood pressure was okay and stopped and blood pressure was okay at least until this last episode of stress and headaches. Now blood pressure noted to be very high and certainly may be contributing to headaches.  Relevant past medical, surgical, family and social history reviewed and updated as indicated. Interim medical history since our last visit reviewed. Allergies and medications reviewed and updated.  Review of Systems  Constitutional: Negative.   Respiratory: Negative.   Cardiovascular: Negative.     Per HPI unless specifically indicated above     Objective:    BP (!) 179/104   Pulse 98   Wt 203 lb (92.1 kg)   SpO2 98%   BMI 27.53 kg/m   Wt Readings from Last 3 Encounters:  05/19/17 203 lb (92.1 kg)  04/23/17 204 lb (92.5 kg)  03/08/17 209 lb (94.8 kg)    Physical Exam  Constitutional: He is oriented to person, place, and time. He appears well-developed and well-nourished.  HENT:  Head: Normocephalic and atraumatic.  Eyes: Conjunctivae and EOM are normal.  Neck: Normal range of motion.  Cardiovascular: Normal rate, regular rhythm and normal heart sounds.  Pulmonary/Chest: Effort normal and breath sounds normal.  Musculoskeletal: Normal range of motion.  Neurological: He is alert and oriented to  person, place, and time.  Skin: No erythema.  Psychiatric: He has a normal mood and affect. His behavior is normal. Judgment and thought content normal.        Assessment & Plan:  Discussed massage  Follow up plan: Return in about 4 weeks (around 06/16/2017) for BMP.

## 2017-05-19 NOTE — Assessment & Plan Note (Signed)
Discuss hypertension medications patient will continue working on lifestyle measures to control blood pressure will add lisinopril 10 mg back to schedule if too strong may cut in half if not strong enough may double.  Will need to recheck 1 month with BMP

## 2017-05-19 NOTE — Assessment & Plan Note (Signed)
Discussed tension headaches will use meloxicam and give cyclobenzaprine as a evening medication to help with some tension.  Also some range of motion exercises.

## 2017-06-15 ENCOUNTER — Ambulatory Visit (INDEPENDENT_AMBULATORY_CARE_PROVIDER_SITE_OTHER): Payer: 59 | Admitting: Family Medicine

## 2017-06-15 ENCOUNTER — Encounter: Payer: Self-pay | Admitting: Family Medicine

## 2017-06-15 VITALS — BP 133/81 | HR 77 | Temp 98.9°F | Ht 70.3 in | Wt 199.7 lb

## 2017-06-15 DIAGNOSIS — R229 Localized swelling, mass and lump, unspecified: Secondary | ICD-10-CM | POA: Diagnosis not present

## 2017-06-15 DIAGNOSIS — E782 Mixed hyperlipidemia: Secondary | ICD-10-CM

## 2017-06-15 DIAGNOSIS — I10 Essential (primary) hypertension: Secondary | ICD-10-CM | POA: Diagnosis not present

## 2017-06-15 DIAGNOSIS — IMO0002 Reserved for concepts with insufficient information to code with codable children: Secondary | ICD-10-CM

## 2017-06-15 DIAGNOSIS — Z0001 Encounter for general adult medical examination with abnormal findings: Secondary | ICD-10-CM | POA: Diagnosis not present

## 2017-06-15 DIAGNOSIS — Z0000 Encounter for general adult medical examination without abnormal findings: Secondary | ICD-10-CM

## 2017-06-15 LAB — MICROSCOPIC EXAMINATION: Bacteria, UA: NONE SEEN

## 2017-06-15 LAB — UA/M W/RFLX CULTURE, ROUTINE
Bilirubin, UA: NEGATIVE
GLUCOSE, UA: NEGATIVE
Ketones, UA: NEGATIVE
Leukocytes, UA: NEGATIVE
NITRITE UA: NEGATIVE
Protein, UA: NEGATIVE
Specific Gravity, UA: >1.03 — ABNORMAL HIGH (ref 1.005–1.030)
Urobilinogen, Ur: 0.2 mg/dL (ref 0.2–1.0)
pH, UA: 5.5 (ref 5.0–7.5)

## 2017-06-15 LAB — MICROALBUMIN, URINE WAIVED
Creatinine, Urine Waived: 300 mg/dL (ref 10–300)
MICROALB, UR WAIVED: 30 mg/L — AB (ref 0–19)

## 2017-06-15 MED ORDER — LISINOPRIL 10 MG PO TABS: 10.0000 mg | ORAL_TABLET | Freq: Every day | ORAL | 1 refills | Status: DC

## 2017-06-15 MED ORDER — MELOXICAM 15 MG PO TABS: 15.0000 mg | ORAL_TABLET | Freq: Every day | ORAL | 1 refills | Status: DC

## 2017-06-15 MED ORDER — SUMATRIPTAN SUCCINATE 100 MG PO TABS: 100.0000 mg | ORAL_TABLET | ORAL | 12 refills | Status: DC | PRN

## 2017-06-15 NOTE — Assessment & Plan Note (Signed)
Under good control on current regimen. Continue to monitor. Call with any concerns.  

## 2017-06-15 NOTE — Assessment & Plan Note (Signed)
Not currently on any medicine. Will check labs. Await results and treat as needed. Call with any concerns.

## 2017-06-15 NOTE — Progress Notes (Signed)
BP 133/81   Pulse 77   Temp 98.9 F (37.2 C) (Oral)   Ht 5' 10.3" (1.786 m)   Wt 199 lb 11.2 oz (90.6 kg)   SpO2 99%   BMI 28.41 kg/m    Subjective:    Patient ID: Nathaniel Young, male    DOB: [birth year], 47 y.o.   MRN: [id number]  HPI: Nathaniel Young is a 47 y.o. male presenting on 06/15/2017 for comprehensive medical examination. Current medical complaints include:  LUMP Duration: 2 months Location:  L hip Onset: sudden Painful: no Discomfort: no Status:  not changing Trauma: no Redness: yes Bruising: no Recent infection: no Swollen lymph nodes: no Requesting removal: no History of cancer: no Family history of cancer: no History of the same: no Associated signs and symptoms: none  HYPERTENSION / HYPERLIPIDEMIA Satisfied with current treatment? no Duration of hypertension: chronic BP monitoring frequency: not checking BP medication side effects: no Past BP meds:  lisinopril Duration of hyperlipidemia: chronic Cholesterol medication side effects: no Cholesterol supplements: none Past cholesterol medications: none Medication compliance: excellent compliance Aspirin: no Recent stressors: no Recurrent headaches: yes Visual changes: no Palpitations: no Dyspnea: no Chest pain: no Lower extremity edema: no Dizzy/lightheaded: no  He currently lives with: wife Interim Problems from his last visit: no  Depression Screen done today and results listed below:  Depression screen [facility] 2/9 06/15/2017 05/19/2017 06/12/2016 06/12/2015 06/12/2015  Decreased Interest 0 0 0 0 0  Down, Depressed, Hopeless 0 0 0 0 0  PHQ - 2 Score 0 0 0 0 0    Past Medical History:  Past Medical History:  Diagnosis Date  . Diverticulosis   . Hyperlipidemia   . Hypertension   . Nephrolithiasis   . Urticaria     Surgical History:  Past Surgical History:  Procedure Laterality Date  . COLONOSCOPY WITH PROPOFOL N/A 01/27/2017   Procedure: COLONOSCOPY WITH PROPOFOL;  Surgeon: Jonathon Bellows, MD;  Location: [facility] ENDOSCOPY;  Service: Gastroenterology;  Laterality: N/A;    Medications:  Current Outpatient Medications on File Prior to Visit  Medication Sig  . cyclobenzaprine (FLEXERIL) 5 MG tablet Take 1 tablet (5 mg total) by mouth 3 (three) times daily as needed for muscle spasms.  . sucralfate (CARAFATE) 1 g tablet TAKE 1 TABLET (1 G TOTAL) BY MOUTH 4 (FOUR) TIMES DAILY - WITH MEALS AND AT BEDTIME.   No current facility-administered medications on file prior to visit.     Allergies:  Allergies  Allergen Reactions  . Robaxin [Methocarbamol] Rash    Social History:  Social History   Socioeconomic History  . Marital status: Married    Spouse name: Not on file  . Number of children: Not on file  . Years of education: Not on file  . Highest education level: Not on file  Social Needs  . Financial resource strain: Not on file  . Food insecurity - worry: Not on file  . Food insecurity - inability: Not on file  . Transportation needs - medical: Not on file  . Transportation needs - non-medical: Not on file  Occupational History  . Not on file  Tobacco Use  . Smoking status: Never Smoker  . Smokeless tobacco: Never Used  Substance and Sexual Activity  . Alcohol use: No  . Drug use: No  . Sexual activity: Yes  Other Topics Concern  . Not on file  Social History Narrative  . Not on file   Social History   Tobacco  Use  Smoking Status Never Smoker  Smokeless Tobacco Never Used   Social History   Substance and Sexual Activity  Alcohol Use No    Family History:  Family History  Problem Relation Age of Onset  . Hypertension Mother   . Cancer Father        throat  . COPD Father   . AAA (abdominal aortic aneurysm) Father   . Aneurysm Father        brain    Past medical history, surgical history, medications, allergies, family history and social history reviewed with patient today and changes made to appropriate areas of the chart.   Review of  Systems  Constitutional: Negative.   HENT: Negative.   Eyes: Negative.   Respiratory: Negative.   Cardiovascular: Negative.   Gastrointestinal: Negative.   Genitourinary: Negative.   Musculoskeletal: Negative.   Skin: Negative.   Endo/Heme/Allergies: Negative.     All other ROS negative except what is listed above and in the HPI.      Objective:    BP 133/81   Pulse 77   Temp 98.9 F (37.2 C) (Oral)   Ht 5' 10.3" (1.786 m)   Wt 199 lb 11.2 oz (90.6 kg)   SpO2 99%   BMI 28.41 kg/m   Wt Readings from Last 3 Encounters:  06/15/17 199 lb 11.2 oz (90.6 kg)  05/19/17 203 lb (92.1 kg)  04/23/17 204 lb (92.5 kg)    Physical Exam  Constitutional: He is oriented to person, place, and time. He appears well-developed and well-nourished. No distress.  HENT:  Head: Normocephalic and atraumatic.  Right Ear: Hearing, tympanic membrane, external ear and ear canal normal.  Left Ear: Hearing, tympanic membrane, external ear and ear canal normal.  Nose: Nose normal.  Mouth/Throat: Uvula is midline, oropharynx is clear and moist and mucous membranes are normal. No oropharyngeal exudate.  Eyes: Conjunctivae, EOM and lids are normal. Pupils are equal, round, and reactive to light. Right eye exhibits no discharge. Left eye exhibits no discharge. No scleral icterus.  Neck: Normal range of motion. Neck supple. No JVD present. No tracheal deviation present. No thyromegaly present.  Cardiovascular: Normal rate, regular rhythm, normal heart sounds and intact distal pulses. Exam reveals no gallop and no friction rub.  No murmur heard. Pulmonary/Chest: Effort normal and breath sounds normal. No stridor. No respiratory distress. He has no wheezes. He has no rales. He exhibits no tenderness.  Abdominal: Soft. Bowel sounds are normal. He exhibits no distension and no mass. There is no tenderness. There is no rebound and no guarding. Hernia confirmed negative in the right inguinal area and confirmed  negative in the left inguinal area.  Genitourinary: Testes normal and penis normal. Cremasteric reflex is present. Right testis shows no mass, no swelling and no tenderness. Right testis is descended. Cremasteric reflex is not absent on the right side. Left testis shows no mass, no swelling and no tenderness. Left testis is descended. Cremasteric reflex is not absent on the left side. Circumcised. No phimosis, paraphimosis, hypospadias, penile erythema or penile tenderness. No discharge found.  Musculoskeletal: Normal range of motion. He exhibits no edema, tenderness or deformity.  Lymphadenopathy:    He has no cervical adenopathy.  Neurological: He is alert and oriented to person, place, and time. He has normal reflexes. He displays normal reflexes. No cranial nerve deficit. He exhibits normal muscle tone. Coordination normal.  Skin: Skin is warm, dry and intact. No rash noted. He is not diaphoretic. No  erythema. No pallor.  1 cm hard, mobile lump on lateral L hip  Psychiatric: He has a normal mood and affect. His speech is normal and behavior is normal. Judgment and thought content normal. Cognition and memory are normal.  Nursing note and vitals reviewed.     Assessment & Plan:   Problem List Items Addressed This Visit      Cardiovascular and Mediastinum   Hypertension    Under good control on current regimen. Continue to monitor. Call with any concerns.       Relevant Medications   lisinopril (PRINIVIL,ZESTRIL) 10 MG tablet   Other Relevant Orders   CBC with Differential/Platelet   Comprehensive metabolic panel   Microalbumin, Urine Waived   TSH   UA/M w/rflx Culture, Routine     Other   Hyperlipidemia    Not currently on any medicine. Will check labs. Await results and treat as needed. Call with any concerns.      Relevant Medications   lisinopril (PRINIVIL,ZESTRIL) 10 MG tablet   Other Relevant Orders   CBC with Differential/Platelet   Comprehensive metabolic panel    Lipid Panel w/o Chol/HDL Ratio    Other Visit Diagnoses    Routine general medical examination at a health care facility    -  Primary   Vaccines up to date. Screening labs checked today. Continue diet and exercise. Call with any concerns.    Relevant Orders   CBC with Differential/Platelet   Comprehensive metabolic panel   Lipid Panel w/o Chol/HDL Ratio   Microalbumin, Urine Waived   TSH   UA/M w/rflx Culture, Routine   Lump       Will try moist compresses and heat for 2 weeks, if not better, will arrange [place]       Discussed aspirin prophylaxis for myocardial infarction prevention and decision was it was not indicated  LABORATORY TESTING:  Health maintenance labs ordered today as discussed above.   IMMUNIZATIONS:   - Tdap: Tetanus vaccination status reviewed: last tetanus booster within 10 years. - Influenza: Refused - Pneumovax: Refused  SCREENING: - Colonoscopy: Up to date  Discussed with patient purpose of the colonoscopy is to detect colon cancer at curable precancerous or early stages   PATIENT COUNSELING:    Sexuality: Discussed sexually transmitted diseases, partner selection, use of condoms, avoidance of unintended pregnancy  and contraceptive alternatives.   Advised to avoid cigarette smoking.  I discussed with the patient that most people either abstain from alcohol or drink within safe limits (<=14/week and <=4 drinks/occasion for males, <=7/weeks and <= 3 drinks/occasion for females) and that the risk for alcohol disorders and other health effects rises proportionally with the number of drinks per week and how often a drinker exceeds daily limits.  Discussed cessation/primary prevention of drug use and availability of treatment for abuse.   Diet: Encouraged to adjust caloric intake to maintain  or achieve ideal body weight, to reduce intake of dietary saturated fat and total fat, to limit sodium intake by avoiding high sodium foods and not adding table salt, and  to maintain adequate dietary potassium and calcium preferably from fresh fruits, vegetables, and low-fat dairy products.    stressed the importance of regular exercise  Injury prevention: Discussed safety belts, safety helmets, smoke detector, smoking near bedding or upholstery.   Dental health: Discussed importance of regular tooth brushing, flossing, and dental visits.   Follow up plan: NEXT PREVENTATIVE PHYSICAL DUE IN 1 YEAR. Return in about 6 months (around 12/16/2017)  for 6 month follow up.

## 2017-06-16 LAB — COMPREHENSIVE METABOLIC PANEL
A/G RATIO: 1.9 (ref 1.2–2.2)
ALBUMIN: 4.2 g/dL (ref 3.5–5.5)
ALT: 20 IU/L (ref 0–44)
AST: 16 IU/L (ref 0–40)
Alkaline Phosphatase: 55 IU/L (ref 39–117)
BILIRUBIN TOTAL: 0.5 mg/dL (ref 0.0–1.2)
BUN / CREAT RATIO: 12 (ref 9–20)
BUN: 13 mg/dL (ref 6–24)
CHLORIDE: 106 mmol/L (ref 96–106)
CO2: 25 mmol/L (ref 20–29)
Calcium: 9.4 mg/dL (ref 8.7–10.2)
Creatinine, Ser: 1.1 mg/dL (ref 0.76–1.27)
GFR calc non Af Amer: 80 mL/min/{1.73_m2} (ref >59)
GFR, EST AFRICAN AMERICAN: 92 mL/min/{1.73_m2} (ref >59)
GLUCOSE: 105 mg/dL — AB (ref 65–99)
Globulin, Total: 2.2 g/dL (ref 1.5–4.5)
POTASSIUM: 5.5 mmol/L — AB (ref 3.5–5.2)
Sodium: 147 mmol/L — ABNORMAL HIGH (ref 134–144)
TOTAL PROTEIN: 6.4 g/dL (ref 6.0–8.5)

## 2017-06-16 LAB — CBC WITH DIFFERENTIAL/PLATELET
BASOS ABS: 0 10*3/uL (ref 0.0–0.2)
Basos: 1 %
EOS (ABSOLUTE): 0.1 10*3/uL (ref 0.0–0.4)
Eos: 1 %
HEMOGLOBIN: 14.2 g/dL (ref 13.0–17.7)
Hematocrit: 41.9 % (ref 37.5–51.0)
Immature Grans (Abs): 0 10*3/uL (ref 0.0–0.1)
Immature Granulocytes: 0 %
LYMPHS ABS: 1.8 10*3/uL (ref 0.7–3.1)
Lymphs: 32 %
MCH: 28.5 pg (ref 26.6–33.0)
MCHC: 33.9 g/dL (ref 31.5–35.7)
MCV: 84 fL (ref 79–97)
Monocytes Absolute: 0.5 10*3/uL (ref 0.1–0.9)
Monocytes: 8 %
NEUTROS ABS: 3.2 10*3/uL (ref 1.4–7.0)
Neutrophils: 58 %
Platelets: 290 10*3/uL (ref 150–379)
RBC: 4.98 x10E6/uL (ref 4.14–5.80)
RDW: 13.5 % (ref 12.3–15.4)
WBC: 5.6 10*3/uL (ref 3.4–10.8)

## 2017-06-16 LAB — LIPID PANEL W/O CHOL/HDL RATIO
CHOLESTEROL TOTAL: 201 mg/dL — AB (ref 100–199)
HDL: 40 mg/dL (ref >39)
LDL Calculated: 125 mg/dL — ABNORMAL HIGH (ref 0–99)
Triglycerides: 180 mg/dL — ABNORMAL HIGH (ref 0–149)
VLDL CHOLESTEROL CAL: 36 mg/dL (ref 5–40)

## 2017-06-16 LAB — TSH: TSH: 0.711 u[IU]/mL (ref 0.450–4.500)

## 2017-06-18 ENCOUNTER — Encounter: Payer: Self-pay | Admitting: Family Medicine

## 2017-06-18 ENCOUNTER — Telehealth: Payer: Self-pay | Admitting: Family Medicine

## 2017-06-18 DIAGNOSIS — E875 Hyperkalemia: Secondary | ICD-10-CM

## 2017-06-18 NOTE — Telephone Encounter (Signed)
Called patient with his results. Hyperkalemia. Just started MVI. Stop MVI. Return in 1 week for recheck BMP. Order in. Rest of labs normal.

## 2017-07-12 ENCOUNTER — Telehealth: Payer: Self-pay | Admitting: Family Medicine

## 2017-07-12 DIAGNOSIS — R229 Localized swelling, mass and lump, unspecified: Principal | ICD-10-CM

## 2017-07-12 DIAGNOSIS — IMO0002 Reserved for concepts with insufficient information to code with codable children: Secondary | ICD-10-CM

## 2017-07-12 NOTE — Telephone Encounter (Signed)
Lump no better. Will arrange [place]. Order placed

## 2017-07-12 NOTE — Telephone Encounter (Signed)
-----   Message from Valerie Roys, DO sent at 06/15/2017  9:30 AM EDT ----- Call about lump on hip, if not better, get [place]

## 2017-07-16 ENCOUNTER — Telehealth: Payer: Self-pay | Admitting: Family Medicine

## 2017-07-16 ENCOUNTER — Other Ambulatory Visit: Payer: Self-pay | Admitting: Family Medicine

## 2017-07-16 ENCOUNTER — Ambulatory Visit
Admission: RE | Admit: 2017-07-16 | Discharge: 2017-07-16 | Disposition: A | Discharge status: Home / Self Care | Payer: 59 | Source: Ambulatory Visit | Attending: Family Medicine | Admitting: Family Medicine

## 2017-07-16 DIAGNOSIS — R222 Localized swelling, mass and lump, trunk: Secondary | ICD-10-CM | POA: Diagnosis not present

## 2017-07-16 DIAGNOSIS — R229 Localized swelling, mass and lump, unspecified: Principal | ICD-10-CM

## 2017-07-16 DIAGNOSIS — IMO0002 Reserved for concepts with insufficient information to code with codable children: Secondary | ICD-10-CM

## 2017-07-16 NOTE — Telephone Encounter (Signed)
Copied from [place] [phone]. Topic: Quick Communication - See Telephone Encounter >> Jul 16, 2017  2:24 PM Ether Griffins B wrote: CRM for notification. See Telephone encounter for: 07/16/17.  Megan [place] tech calling stating the pt is at [facility] for [place] but the order needs to be for his LEFT leg. Please let her know when order is corrected. Her CB# [phone].

## 2017-07-16 NOTE — Telephone Encounter (Signed)
Called patient to give him results. LMOM for him to call me back- if don't hear by end of the day, will call him Monday. They are not able to tell what that spot is on [place], so we will get him into see the surgeon to get a piece of it or remove it so we can find out what it is.

## 2017-07-19 ENCOUNTER — Encounter: Payer: Self-pay | Admitting: *Deleted

## 2017-07-19 NOTE — Telephone Encounter (Signed)
Discussed results with Jenny Reichmann. Will get him into surgeon for removal. Call with any concerns.

## 2017-07-20 ENCOUNTER — Telehealth: Payer: Self-pay | Admitting: Family Medicine

## 2017-07-20 NOTE — Telephone Encounter (Signed)
[place] [facility] cannot see patient before end of month. [place] will see him tomorrow at 3:45. Left detailed message for patient as well as spoke with his wife. DPR was reviewed prior.

## 2017-07-20 NOTE — Telephone Encounter (Signed)
Routing to [employer]

## 2017-07-20 NOTE — Telephone Encounter (Signed)
Melanie from [place] is calling maybe Tiffany back she didn't get the name who she spoke with please give Threasa Beards a call back at [phone]

## 2017-07-20 NOTE — Telephone Encounter (Signed)
Copied from [place] [id number]. Topic: Quick Communication - See Telephone Encounter >> Jul 20, 2017 11:42 AM Ether Griffins B wrote: CRM for notification. See Telephone encounter for: 07/20/17.  Pt would like to talk with Dr. Wynetta Emery about his wife who has been laid off and insurance is lapsing at the end of the month and Dr. Dwyane Luo office cant get the patient in until 08/17/17. And he will not have insurance at that time. He is wondering if there is a different route he could take.

## 2017-07-21 ENCOUNTER — Encounter: Payer: Self-pay | Admitting: Surgery

## 2017-07-21 ENCOUNTER — Ambulatory Visit: Payer: 59 | Admitting: Surgery

## 2017-07-21 VITALS — BP 152/84 | HR 75 | Temp 98.3°F | Ht 70.3 in | Wt 201.4 lb

## 2017-07-21 DIAGNOSIS — R2242 Localized swelling, mass and lump, left lower limb: Secondary | ICD-10-CM

## 2017-07-21 NOTE — Patient Instructions (Signed)
We will see you back in the office tomorrow.

## 2017-07-21 NOTE — Progress Notes (Signed)
[name] is an 47 y.o. male.   Chief Complaint: Lump on hip Consult requested by Dr. Wynetta Emery HPI: This a patient with a lump on his left hip.  He noticed approximately 3 months ago.  It is not causing him any pain and is not been growing.  He has no history of trauma .  Past Medical History:  Diagnosis Date  . Diverticulosis   . Hyperlipidemia   . Hypertension   . Nephrolithiasis   . Urticaria     Past Surgical History:  Procedure Laterality Date  . COLONOSCOPY WITH PROPOFOL N/A 01/27/2017   Procedure: COLONOSCOPY WITH PROPOFOL;  Surgeon: Jonathon Bellows, MD;  Location: [facility] ENDOSCOPY;  Service: Gastroenterology;  Laterality: N/A;    Family History  Problem Relation Age of Onset  . Hypertension Mother   . Cancer Father        throat  . COPD Father   . AAA (abdominal aortic aneurysm) Father   . Aneurysm Father        brain   Social History:  reports that he has never smoked. He has never used smokeless tobacco. He reports that he does not drink alcohol or use drugs.  Allergies:  Allergies  Allergen Reactions  . Robaxin [Methocarbamol] Rash     (Not in a hospital admission)   Review of Systems:   Review of Systems  Constitutional: Negative.   HENT: Negative.   Eyes: Negative.   Respiratory: Negative.   Cardiovascular: Negative.   Gastrointestinal: Negative.   Genitourinary: Negative.   Musculoskeletal: Negative.   Skin: Negative.   Neurological: Negative.   Endo/Heme/Allergies: Negative.   Psychiatric/Behavioral: Negative.     Physical Exam:  Physical Exam  Constitutional: He is oriented to person, place, and time. He appears well-developed and well-nourished. No distress.  HENT:  Head: Normocephalic and atraumatic.  Eyes: Pupils are equal, round, and reactive to light. Right eye exhibits no discharge. Left eye exhibits no discharge. No scleral icterus.  Neck: Normal range of motion.  Cardiovascular: Normal rate, regular rhythm and normal heart  sounds.  Pulmonary/Chest: Effort normal. No stridor. No respiratory distress. He has no wheezes. He has no rales.  Abdominal: Soft. He exhibits no distension and no mass. There is no tenderness. There is no rebound and no guarding.  Musculoskeletal: Normal range of motion. He exhibits no edema, tenderness or deformity.  Lymphadenopathy:    He has no cervical adenopathy.  Neurological: He is alert and oriented to person, place, and time.  Skin: Skin is warm. He is not diaphoretic. No erythema.  Mass on left hip measuring 1.4 cm roughly with mild erythema  Vitals reviewed. 1/2 cm mass on the left lateral hip near the belt line.  It is mildly erythematous but hard and non-mobile it appears to be in the skin rather than beneath the skin.  It is not typical of a lipoma.  It is nontender.  There were no vitals taken for this visit.    No results found for this or any previous visit (from the past 48 hour(s)). No results found.   Assessment/Plan  This a patient with a mass on his left hip.  He had an ultrasound that showed a 1.4 cm atypical lesion not significantly suggestive of a lipoma.  Mass in the left hip that is not typical for lipoma.  Recommend excisional biopsy.  The rationale for this was discussed the options of observation reviewed and the risks of bleeding infection recurrence and cosmetic deformity  were discussed he understood and agreed to proceed.  Florene Glen, MD, FACS

## 2017-07-22 ENCOUNTER — Telehealth: Payer: Self-pay

## 2017-07-22 ENCOUNTER — Other Ambulatory Visit: Payer: Self-pay | Admitting: Surgery

## 2017-07-22 ENCOUNTER — Encounter: Payer: Self-pay | Admitting: Surgery

## 2017-07-22 ENCOUNTER — Ambulatory Visit (INDEPENDENT_AMBULATORY_CARE_PROVIDER_SITE_OTHER): Payer: 59 | Admitting: Surgery

## 2017-07-22 VITALS — BP 133/82 | HR 64 | Temp 98.1°F | Ht 70.3 in

## 2017-07-22 DIAGNOSIS — R2242 Localized swelling, mass and lump, left lower limb: Secondary | ICD-10-CM | POA: Diagnosis not present

## 2017-07-22 NOTE — Telephone Encounter (Signed)
Spoke with automated service [facility] for specimen pick up.  PLaced in drop box and hung over door. Confirmation number [id number].

## 2017-07-22 NOTE — Patient Instructions (Signed)
We have removed a Cyst in our office today.  You have sutures under the skin that will dissolve and also dermabond (skin glue) on top of your skin which will come off on it's own in 10-14 days.  You may shower in 48 hours, this is on 07/24/17.Nathaniel Young  Avoid Strenuous activities that will make you sweat during the next 48 hours to avoid the glue coming off prematurely. Avoid activities that will place pressure to this area of the body for 1-2 weeks to avoid re-injury to incision site.  Please see your follow-up appointment provided. We will see you back in office to make sure this area is healed and to review the final pathology. If you have any questions or concerns prior to this appointment, call our office and speak with a nurse.    Excision of Skin Cysts or Lesions Excision of a skin lesion refers to the removal of a section of skin by making small cuts (incisions) in the skin. This procedure may be done to remove a cancerous (malignant) or noncancerous (benign) growth on the skin. It is typically done to treat or prevent cancer or infection. It may also be done to improve cosmetic appearance. The procedure may be done to remove:  Cancerous growths, such as basal cell carcinoma, squamous cell carcinoma, or melanoma.  Noncancerous growths, such as a cyst or lipoma.  Growths, such as moles or skin tags, which may be removed for cosmetic reasons.  Various excision or surgical techniques may be used depending on your condition, the location of the lesion, and your overall health. Tell a health care provider about:  Any allergies you have.  All medicines you are taking, including vitamins, herbs, eye drops, creams, and over-the-counter medicines.  Any problems you or family members have had with anesthetic medicines.  Any blood disorders you have.  Any surgeries you have had.  Any medical conditions you have.  Whether you are pregnant or may be pregnant. What are the risks? Generally,  this is a safe procedure. However, problems may occur, including:  Bleeding.  Infection.  Scarring.  Recurrence of the cyst, lipoma, or cancer.  Changes in skin sensation or appearance, such as discoloration or swelling.  Reaction to the anesthetics.  Allergic reaction to surgical materials or ointments.  Damage to nerves, blood vessels, muscles, or other structures.  Continued pain.  What happens before the procedure?  Ask your health care provider about: ? Changing or stopping your regular medicines. This is especially important if you are taking diabetes medicines or blood thinners. ? Taking medicines such as aspirin and ibuprofen. These medicines can thin your blood. Do not take these medicines before your procedure if your health care provider instructs you not to.  You may be asked to take certain medicines.  You may be asked to stop smoking.  You may have an exam or testing.  Plan to have someone take you home after the procedure.  Plan to have someone help you with activities during recovery. What happens during the procedure?  To reduce your risk of infection: ? Your health care team will wash or sanitize their hands. ? Your skin will be washed with soap.  You will be given a medicine to numb the area (local anesthetic).  One of the following excision techniques will be performed.  At the end of any of these procedures, antibiotic ointment will be applied as needed. Each of the following techniques may vary among health care providers and hospitals. Complete  Surgical Excision The area of skin that needs to be removed will be marked with a pen. Using a small scalpel or scissors, the surgeon will gently cut around and under the lesion until it is completely removed. The lesion will be placed in a fluid and sent to the lab for examination. If necessary, bleeding will be controlled with a device that delivers heat (electrocautery). The edges of the wound may be  stitched (sutured) together, and a bandage (dressing) will be applied. This procedure may be performed to treat a cancerous growth or a noncancerous cyst or lesion. Excision of a Cyst The surgeon will make an incision on the cyst. The entire cyst will be removed through the incision. The incision may be closed with sutures. Shave Excision During shave excision, the surgeon will use a small blade or an electrically heated loop instrument to shave off the lesion. This may be done to remove a mole or a skin tag. The wound will usually be left to heal on its own without sutures. Punch Excision During punch excision, the surgeon will use a small tool that is like a cookie cutter or a hole punch to cut a circle shape out of the skin. The outer edges of the skin will be sutured together. This may be done to remove a mole or a scar or to perform a biopsy of the lesion. Mohs Micrographic Surgery During Mohs micrographic surgery, layers of the lesion will be removed with a scalpel or a loop instrument and will be examined right away under a microscope. Layers will be removed until all of the abnormal or cancerous tissue has been removed. This procedure is minimally invasive, and it ensures the best cosmetic outcome. It involves the removal of as little normal tissue as possible. Mohs is usually done to treat skin cancer, such as basal cell carcinoma or squamous cell carcinoma, particularly on the face and ears. Depending on the size of the surgical wound, it may be sutured closed. What happens after the procedure?  Return to your normal activities as told by your health care provider.  Talk with your health care provider to discuss any test results, treatment options, and if necessary, the need for more tests. This information is not intended to replace advice given to you by your health care provider. Make sure you discuss any questions you have with your health care provider. Document Released: 06/17/2009  Document Revised: 08/29/2015 Document Reviewed: 05/09/2014 Elsevier Interactive Patient Education  [employer].

## 2017-07-22 NOTE — Progress Notes (Signed)
This is a patient here for excision and biopsy of a left hip mass.  We reviewed again the rationale for offering the surgery and the risks of bleeding infection recurrence and cosmetic deformity he understood and agreed to proceed.  After obtaining informed consent and a surgical pause was held.  The patient was prepped and draped in a sterile fashion and local anesthetic was infiltrated into the skin and subcutaneous tissues around the visible and palpable mass.  A lenticular shaped incision was then made measuring approximately 3 cm.  The mass was excised and sent off for examination.  There was essentially no bleeding at this point.  The wound was closed with 3-0 nylon horizontal mattress sutures and a running 3 oh locking nylon suture as well.  Sterile dressing was placed.  Patient tolerated this procedure well there were no complications we will call him with results of the biopsy and see him in 2 weeks to remove the sutures.

## 2017-07-27 LAB — PATHOLOGY

## 2017-08-02 ENCOUNTER — Telehealth: Payer: Self-pay

## 2017-08-02 NOTE — Telephone Encounter (Signed)
Left message for patient to return call to office regarding biopsy results.

## 2017-08-04 ENCOUNTER — Encounter: Payer: Self-pay | Admitting: Surgery

## 2017-08-11 ENCOUNTER — Ambulatory Visit (INDEPENDENT_AMBULATORY_CARE_PROVIDER_SITE_OTHER): Payer: 59 | Admitting: Surgery

## 2017-08-11 ENCOUNTER — Encounter: Payer: Self-pay | Admitting: Surgery

## 2017-08-11 VITALS — BP 127/81 | HR 87 | Resp 14 | Ht 71.0 in | Wt 204.0 lb

## 2017-08-11 DIAGNOSIS — R2242 Localized swelling, mass and lump, left lower limb: Secondary | ICD-10-CM

## 2017-08-11 NOTE — Patient Instructions (Signed)
Return as needed.The patient is aware to call back for any questions or concerns.  

## 2017-08-11 NOTE — Progress Notes (Signed)
Outpatient postop visit  08/11/2017  Nathaniel Young. is an 47 y.o. male.    Procedure: Excision of left hip mass  CC: No complaints  HPI: Patient status post excision of the left hip mass.  He is doing well having no problems and is back to work.  Medications reviewed.    Physical Exam:  There were no vitals taken for this visit.    PE: Wound healed well no erythema no drainage nylon sutures are removed Steri-Strips are placed.    Assessment/Plan:  Pathology is confirmed with the patient.  No malignancy.  Patient doing very well follow-up on an as-needed basis  Florene Glen, MD, FACS

## 2017-08-17 ENCOUNTER — Ambulatory Visit: Payer: Self-pay | Admitting: General Surgery

## 2018-03-17 ENCOUNTER — Other Ambulatory Visit: Payer: Self-pay

## 2018-03-17 MED ORDER — LISINOPRIL 10 MG PO TABS: 10.0000 mg | ORAL_TABLET | Freq: Every day | ORAL | 1 refills | Status: DC

## 2018-04-18 ENCOUNTER — Encounter: Payer: Self-pay | Admitting: Nurse Practitioner

## 2018-04-18 ENCOUNTER — Ambulatory Visit (INDEPENDENT_AMBULATORY_CARE_PROVIDER_SITE_OTHER): Payer: Self-pay | Admitting: Nurse Practitioner

## 2018-04-18 ENCOUNTER — Ambulatory Visit: Payer: Self-pay

## 2018-04-18 ENCOUNTER — Other Ambulatory Visit: Payer: Self-pay

## 2018-04-18 VITALS — BP 102/68 | HR 65 | Temp 98.0°F | Ht 72.0 in | Wt 206.0 lb

## 2018-04-18 DIAGNOSIS — I10 Essential (primary) hypertension: Secondary | ICD-10-CM

## 2018-04-18 NOTE — Assessment & Plan Note (Addendum)
Episode of orthostatic hypotension today, feeling better at this time.  Refuses labs or EKG today d/t OOP cost.  Will continue Lisinopril at current dose at this time, have recommend increasing hydration at home (at least 60 ml every couple hours) and slow position changes.  If continued episodes will decrease Lisinopril to 5 MG daily.  Refuses use of TED hose.  Have recommended returning to clinic if increased episodes.  If symptoms of CP or SOB, immediately go to ER.

## 2018-04-18 NOTE — Patient Instructions (Signed)

## 2018-04-18 NOTE — Telephone Encounter (Signed)
Returned call to patient who states that this AM while getting ready he bent over and when he came up felt dizzy, woozy, and sick to his stomach.  He states he went and layed down. He took his BP and it was 154/101 and 145/95.  He states that he takes lisinopril 10 mg daily and has not yet taken his medication. He denies headache, chest pain, numbness and weakness.  He has no cold symptoms.  His BP currently 147/98  HR 65.  He was instructed to take his medication. Pt still has mild dizziness.  Appointment scheduled per protocol. Care advice read to patient. Pt verbalized understanding of all instructions. Pt will have family member drive him to the office.  Reason for Disposition . Systolic BP  >= 329 OR Diastolic >= 924  Answer Assessment - Initial Assessment Questions 1. DESCRIPTION: "Describe your dizziness."     woozy 2. LIGHTHEADED: "Do you feel lightheaded?" (e.g., somewhat faint, woozy, weak upon standing)    lighheaded 3. VERTIGO: "Do you feel like either you or the room is spinning or tilting?" (i.e. vertigo)     No 4. SEVERITY: "How bad is it?"  "Do you feel like you are going to faint?" "Can you stand and walk?"   - MILD - walking normally   - MODERATE - interferes with normal activities (e.g., work, school)    - SEVERE - unable to stand, requires support to walk, feels like passing out now.      mild 5. ONSET:  "When did the dizziness begin?"     This AM 6. AGGRAVATING FACTORS: "Does anything make it worse?" (e.g., standing, change in head position)     Head position changes  7. HEART RATE: "Can you tell me your heart rate?" "How many beats in 15 seconds?"  (Note: not all patients can do this)      HR 62/60/58  BP 154/101 145/95   8. CAUSE: "What do you think is causing the dizziness?"     BP high 9. RECURRENT SYMPTOM: "Have you had dizziness before?" If so, ask: "When was the last time?" "What happened that time?"     1 on BP meds felt this way 10. OTHER SYMPTOMS: "Do you  have any other symptoms?" (e.g., fever, chest pain, vomiting, diarrhea, bleeding)       no 11. PREGNANCY: "Is there any chance you are pregnant?" "When was your last menstrual period?"       N/A  Answer Assessment - Initial Assessment Questions 1. BLOOD PRESSURE: "What is the blood pressure?" "Did you take at least two measurements 5 minutes apart?"     154/101 145/95  Now 147/98 HR65 2. ONSET: "When did you take your blood pressure?"     This AM 3. HOW: "How did you obtain the blood pressure?" (e.g., visiting nurse, automatic home BP monitor)     Home monitor 4. HISTORY: "Do you have a history of high blood pressure?"     yes 5. MEDICATIONS: "Are you taking any medications for blood pressure?" "Have you missed any doses recently?"     Lisinopril has not taken today's dose 6. OTHER SYMPTOMS: "Do you have any symptoms?" (e.g., headache, chest pain, blurred vision, difficulty breathing, weakness)     No 7. PREGNANCY: "Is there any chance you are pregnant?" "When was your last menstrual period?"     N/A  Protocols used: HIGH BLOOD PRESSURE-A-AH, DIZZINESS - [facility]

## 2018-04-18 NOTE — Progress Notes (Signed)
BP 102/68 (BP Location: Left Arm, Cuff Size: Normal)   Pulse 65   Temp 98 F (36.7 C) (Oral)   Ht 6' (1.829 m)   Wt 206 lb (93.4 kg)   SpO2 98%   BMI 27.94 kg/m    Subjective:    Patient ID: Nathaniel Joy., male    DOB: Oct 26, 1970, 49 y.o.   MRN: [id number]  HPI: Nathaniel Derossett. is a 48 y.o. male  Chief Complaint  Patient presents with  . Hypertension  . Dizziness    lightheadedness this morning   HYPERTENSION Orthostatic BP done.  While sitting 128/86 and standing 102/68.  Had episode of dizziness this morning was knelt down over tub and washing hair, got dizzy at time with spinning and some nausea.  Stood up and held on to wall, has no further dizziness at this time; but reports feeling fatigued.  Did noticed some further dizziness today with rolling over in bed, states things were "swirling" for a minute.  Has h/o migraines, but reports these are not aura symptoms for him.  Prior to taking medicine this morning BP 154/101 and then after 147/98.   Had similar episodes of dizziness when initially placed on BP meds and the dose was "too high" and "BP was going to low".  He endorses poor fluid intake during the day, "20 oz bottle a day while at work".  Currently increased stressors at home and lack of insurance, refuses labs today.  States stress "may be contributing to this". Hypertension status: controlled  Satisfied with current treatment? yes Duration of hypertension: years BP monitoring frequency:  rarely BP range: had not checked since this morning BP medication side effects:  no Medication compliance: good compliance Aspirin: no Recurrent headaches: no Visual changes: no Palpitations: no Dyspnea: no Chest pain: no Lower extremity edema: no Dizzy/lightheaded: yes, today only  Relevant past medical, surgical, family and social history reviewed and updated as indicated. Interim medical history since our last visit reviewed. Allergies and medications reviewed  and updated.  Review of Systems  Constitutional: Negative for activity change, diaphoresis, fatigue and fever.  Respiratory: Negative for cough, chest tightness, shortness of breath and wheezing.   Cardiovascular: Negative for chest pain, palpitations and leg swelling.  Gastrointestinal: Negative for abdominal distention, abdominal pain, constipation, diarrhea, nausea and vomiting.  Endocrine: Negative for cold intolerance, heat intolerance, polydipsia, polyphagia and polyuria.  Musculoskeletal: Negative.   Skin: Negative.   Neurological: Negative for dizziness, syncope, weakness, light-headedness, numbness and headaches.  Psychiatric/Behavioral: Negative.     Per HPI unless specifically indicated above     Objective:    BP 102/68 (BP Location: Left Arm, Cuff Size: Normal)   Pulse 65   Temp 98 F (36.7 C) (Oral)   Ht 6' (1.829 m)   Wt 206 lb (93.4 kg)   SpO2 98%   BMI 27.94 kg/m   Wt Readings from Last 3 Encounters:  04/18/18 206 lb (93.4 kg)  08/11/17 204 lb (92.5 kg)  07/21/17 201 lb 6.4 oz (91.4 kg)    Physical Exam Vitals signs and nursing note reviewed.  Constitutional:      Appearance: He is well-developed.  HENT:     Head: Normocephalic and atraumatic.     Right Ear: Hearing normal. No drainage.     Left Ear: Hearing normal. No drainage.     Mouth/Throat:     Pharynx: Uvula midline.  Eyes:     General: Lids are normal.  Right eye: No discharge.        Left eye: No discharge.     Conjunctiva/sclera: Conjunctivae normal.     Pupils: Pupils are equal, round, and reactive to light.  Neck:     Musculoskeletal: Normal range of motion and neck supple.     Thyroid: No thyromegaly.     Vascular: No carotid bruit or JVD.     Trachea: Trachea normal.  Cardiovascular:     Rate and Rhythm: Normal rate and regular rhythm.     Heart sounds: Normal heart sounds, S1 normal and S2 normal. No murmur. No gallop.   Pulmonary:     Effort: Pulmonary effort is normal.       Breath sounds: Normal breath sounds.  Abdominal:     General: Bowel sounds are normal.     Palpations: Abdomen is soft. There is no hepatomegaly or splenomegaly.  Musculoskeletal: Normal range of motion.  Skin:    General: Skin is warm and dry.     Capillary Refill: Capillary refill takes less than 2 seconds.     Findings: No rash.  Neurological:     Mental Status: He is alert and oriented to person, place, and time.     Deep Tendon Reflexes: Reflexes are normal and symmetric.  Psychiatric:        Mood and Affect: Mood normal.        Behavior: Behavior normal.        Thought Content: Thought content normal.        Judgment: Judgment normal.     Results for orders placed or performed in visit on 07/22/17  Pathology  Result Value Ref Range   . Comment    . Comment    . Comment    . Comment    . Comment    . Comment    . Comment    . Comment       Assessment & Plan:   Problem List Items Addressed This Visit      Cardiovascular and Mediastinum   Hypertension - Primary    Episode of orthostatic hypotension today, feeling better at this time.  Refuses labs or EKG today d/t OOP cost.  Will continue Lisinopril at current dose at this time, have recommend increasing hydration at home (at least 60 ml every couple hours) and slow position changes.  If continued episodes will decrease Lisinopril to 5 MG daily.  Refuses use of TED hose.  Have recommended returning to clinic if increased episodes.  If symptoms of CP or SOB, immediately go to ER.          Follow up plan: Return if symptoms worsen or fail to improve.

## 2018-06-22 ENCOUNTER — Encounter: Payer: 59 | Admitting: Family Medicine

## 2018-09-25 NOTE — Progress Notes (Deleted)
There were no vitals taken for this visit.   Subjective:    Patient ID: Nathaniel Joy., male    DOB: [birth year], 48 y.o.   MRN: [id number]  HPI: Nathaniel Young. is a 48 y.o. male presenting on 09/26/2018 for comprehensive medical examination. Current medical complaints include:  HYPERTENSION / HYPERLIPIDEMIA Satisfied with current treatment? {Blank single:19197::"yes","no"} Duration of hypertension: {Blank single:19197::"chronic","months","years"} BP monitoring frequency: {Blank single:19197::"not checking","rarely","daily","weekly","monthly","a few times a day","a few times a week","a few times a month"} BP range:  BP medication side effects: {Blank single:19197::"yes","no"} Past BP meds: {Blank VZDGLOVF:64332::"RJJO","ACZYSAYTKZ","SWFUXNATFT/DDUKGURKYH","CWCBJSEG","BTDVVOHYWV","PXTGGYIRSW/NIOE","VOJJKKXFGH (bystolic)","carvedilol","chlorthalidone","clonidine","diltiazem","exforge HCT","HCTZ","irbesartan (avapro)","labetalol","lisinopril","lisinopril-HCTZ","losartan (cozaar)","methyldopa","nifedipine","olmesartan (benicar)","olmesartan-HCTZ","quinapril","ramipril","spironalactone","tekturna","valsartan","valsartan-HCTZ","verapamil"} Duration of hyperlipidemia: {Blank single:19197::"chronic","months","years"} Cholesterol medication side effects: {Blank single:19197::"yes","no"} Cholesterol supplements: {Blank multiple:19196::"none","fish oil","niacin","red yeast rice"} Past cholesterol medications: {Blank multiple:19196::"none","atorvastain (lipitor)","lovastatin (mevacor)","pravastatin (pravachol)","rosuvastatin (crestor)","simvastatin (zocor)","vytorin","fenofibrate (tricor)","gemfibrozil","ezetimide (zetia)","niaspan","lovaza"} Medication compliance: {Blank single:19197::"excellent compliance","good compliance","fair compliance","poor compliance"} Aspirin: {Blank single:19197::"yes","no"} Recent stressors: {Blank single:19197::"yes","no"} Recurrent headaches: {Blank  single:19197::"yes","no"} Visual changes: {Blank single:19197::"yes","no"} Palpitations: {Blank single:19197::"yes","no"} Dyspnea: {Blank single:19197::"yes","no"} Chest pain: {Blank single:19197::"yes","no"} Lower extremity edema: {Blank single:19197::"yes","no"} Dizzy/lightheaded: {Blank single:19197::"yes","no"}  He currently lives with: Interim Problems from his last visit: {Blank single:19197::"yes","no"}  Depression Screen done today and results listed below:  Depression screen [facility] 2/9 06/15/2017 05/19/2017 06/12/2016 06/12/2015 06/12/2015  Decreased Interest 0 0 0 0 0  Down, Depressed, Hopeless 0 0 0 0 0  PHQ - 2 Score 0 0 0 0 0    Past Medical History:  Past Medical History:  Diagnosis Date  . Diverticulosis   . Hyperlipidemia   . Hypertension   . Nephrolithiasis   . Urticaria     Surgical History:  Past Surgical History:  Procedure Laterality Date  . COLONOSCOPY WITH PROPOFOL N/A 01/27/2017   Procedure: COLONOSCOPY WITH PROPOFOL;  Surgeon: Jonathon Bellows, MD;  Location: [facility] ENDOSCOPY;  Service: Gastroenterology;  Laterality: N/A;    Medications:  Current Outpatient Medications on File Prior to Visit  Medication Sig  . lisinopril (PRINIVIL,ZESTRIL) 10 MG tablet Take 1 tablet (10 mg total) by mouth daily.  . meloxicam (MOBIC) 15 MG tablet Take 1 tablet (15 mg total) by mouth daily. (Patient not taking: Reported on 04/18/2018)  . SUMAtriptan (IMITREX) 100 MG tablet Take 1 tablet (100 mg total) by mouth every 2 (two) hours as needed for migraine. May repeat in 2 hours if headache persists or recurs.   No current facility-administered medications on file prior to visit.     Allergies:  Allergies  Allergen Reactions  . Robaxin [Methocarbamol] Rash    Social History:  Social History   Socioeconomic History  . Marital status: Married    Spouse name: Not on file  . Number of children: Not on file  . Years of education: Not on file  . Highest education level: Not on  file  Occupational History  . Not on file  Social Needs  . Financial resource strain: Not on file  . Food insecurity    Worry: Not on file    Inability: Not on file  . Transportation needs    Medical: Not on file    Non-medical: Not on file  Tobacco Use  . Smoking status: Never Smoker  . Smokeless tobacco: Never Used  Substance and Sexual Activity  . Alcohol use: No  . Drug use: No  . Sexual activity: Yes  Lifestyle  . Physical activity    Days per week: Not on file    Minutes per session: Not on file  . Stress: Not on file  Relationships  . Social  connections    Talks on phone: Not on file    Gets together: Not on file    Attends religious service: Not on file    Active member of club or organization: Not on file    Attends meetings of clubs or organizations: Not on file    Relationship status: Not on file  . Intimate partner violence    Fear of current or ex partner: Not on file    Emotionally abused: Not on file    Physically abused: Not on file    Forced sexual activity: Not on file  Other Topics Concern  . Not on file  Social History Narrative  . Not on file   Social History   Tobacco Use  Smoking Status Never Smoker  Smokeless Tobacco Never Used   Social History   Substance and Sexual Activity  Alcohol Use No    Family History:  Family History  Problem Relation Age of Onset  . Hypertension Mother   . Cancer Father        throat  . COPD Father   . AAA (abdominal aortic aneurysm) Father   . Aneurysm Father        brain    Past medical history, surgical history, medications, allergies, family history and social history reviewed with patient today and changes made to appropriate areas of the chart.   ROS  All other ROS negative except what is listed above and in the HPI.      Objective:    There were no vitals taken for this visit.  Wt Readings from Last 3 Encounters:  04/18/18 206 lb (93.4 kg)  08/11/17 204 lb (92.5 kg)  07/21/17 201 lb  6.4 oz (91.4 kg)    Physical Exam  Results for orders placed or performed in visit on 07/22/17  Pathology  Result Value Ref Range   . Comment    . Comment    . Comment    . Comment    . Comment    . Comment    . Comment    . Comment       Assessment & Plan:   Problem List Items Addressed This Visit      Cardiovascular and Mediastinum   Hypertension     Other   Hyperlipidemia    Other Visit Diagnoses    Routine general medical examination at a health care facility    -  Primary   Screening for prostate cancer           LABORATORY TESTING:  Health maintenance labs ordered today as discussed above.   The natural history of prostate cancer and ongoing controversy regarding screening and potential treatment outcomes of prostate cancer has been discussed with the patient. The meaning of a false positive PSA and a false negative PSA has been discussed. He indicates understanding of the limitations of this screening test and wishes to proceed with screening PSA testing.   IMMUNIZATIONS:   - Tdap: Tetanus vaccination status reviewed: {tetanus status:315746}. - Influenza: {Blank single:19197::"Up to date","Administered today","Postponed to flu season","Refused","Given elsewhere"} - Pneumovax: {Blank single:19197::"Up to date","Administered today","Not applicable","Refused","Given elsewhere"}  PATIENT COUNSELING:    Sexuality: Discussed sexually transmitted diseases, partner selection, use of condoms, avoidance of unintended pregnancy  and contraceptive alternatives.   Advised to avoid cigarette smoking.  I discussed with the patient that most people either abstain from alcohol or drink within safe limits (<=14/week and <=4 drinks/occasion for males, <=7/weeks and <= 3 drinks/occasion for females)  and that the risk for alcohol disorders and other health effects rises proportionally with the number of drinks per week and how often a drinker exceeds daily limits.  Discussed  cessation/primary prevention of drug use and availability of treatment for abuse.   Diet: Encouraged to adjust caloric intake to maintain  or achieve ideal body weight, to reduce intake of dietary saturated fat and total fat, to limit sodium intake by avoiding high sodium foods and not adding table salt, and to maintain adequate dietary potassium and calcium preferably from fresh fruits, vegetables, and low-fat dairy products.    stressed the importance of regular exercise  Injury prevention: Discussed safety belts, safety helmets, smoke detector, smoking near bedding or upholstery.   Dental health: Discussed importance of regular tooth brushing, flossing, and dental visits.   Follow up plan: NEXT PREVENTATIVE PHYSICAL DUE IN 1 YEAR. No follow-ups on file.

## 2018-09-26 ENCOUNTER — Encounter: Payer: Self-pay | Admitting: Family Medicine

## 2018-09-26 ENCOUNTER — Other Ambulatory Visit: Payer: Self-pay | Admitting: Family Medicine

## 2018-09-26 DIAGNOSIS — Z125 Encounter for screening for malignant neoplasm of prostate: Secondary | ICD-10-CM

## 2018-09-26 DIAGNOSIS — E782 Mixed hyperlipidemia: Secondary | ICD-10-CM

## 2018-09-26 DIAGNOSIS — I10 Essential (primary) hypertension: Secondary | ICD-10-CM

## 2018-09-26 DIAGNOSIS — Z0000 Encounter for general adult medical examination without abnormal findings: Secondary | ICD-10-CM

## 2018-11-22 ENCOUNTER — Encounter: Payer: Self-pay | Admitting: Family Medicine

## 2019-01-24 ENCOUNTER — Other Ambulatory Visit: Payer: Self-pay | Admitting: Family Medicine

## 2019-01-24 NOTE — Telephone Encounter (Signed)
Requested medication (s) are due for refill today: yes  Requested medication (s) are on the active medication list: yes  Last refill:  11/02/2018  Future visit scheduled: no  Notes to clinic: review for refill Overdue for office visit   Requested Prescriptions  Pending Prescriptions Disp Refills   lisinopril (ZESTRIL) 10 MG tablet [Pharmacy Med Name: LISINOPRIL 10 MG TABLET] 90 tablet 1    Sig: TAKE 1 TABLET BY MOUTH EVERY DAY     Cardiovascular:  ACE Inhibitors Failed - 01/24/2019  1:21 AM      Failed - Cr in normal range and within 180 days    Creatinine, Ser  Date Value Ref Range Status  06/15/2017 1.10 0.76 - 1.27 mg/dL Final         Failed - K in normal range and within 180 days    Potassium  Date Value Ref Range Status  06/15/2017 5.5 (H) 3.5 - 5.2 mmol/L Final         Failed - Valid encounter within last 6 months    Recent Outpatient Visits          9 months ago Essential hypertension   [place], Henrine Screws T, NP   1 year ago Routine general medical examination at a health care facility   [facility], [place], DO   1 year ago Essential hypertension   [place], Mark A, MD   2 years ago Essential hypertension   [place], [place], DO   2 years ago Generalized abdominal pain   [place], [place], [place]             Passed - Patient is not pregnant      Passed - Last BP in normal range    BP Readings from Last 1 Encounters:  04/18/18 102/68

## 2019-01-25 NOTE — Telephone Encounter (Signed)
Needs appointment

## 2019-01-26 NOTE — Telephone Encounter (Signed)
LVM for pt to call back to schedule.

## 2019-01-27 NOTE — Telephone Encounter (Signed)
LVM for pt to call back.

## 2019-01-30 ENCOUNTER — Encounter: Payer: Self-pay | Admitting: Family Medicine

## 2019-01-30 NOTE — Telephone Encounter (Signed)
Letter printed to mail. °

## 2019-03-10 ENCOUNTER — Other Ambulatory Visit: Payer: Self-pay | Admitting: Family Medicine

## 2020-02-02 ENCOUNTER — Ambulatory Visit (INDEPENDENT_AMBULATORY_CARE_PROVIDER_SITE_OTHER): Payer: Self-pay | Admitting: Family Medicine

## 2020-02-02 ENCOUNTER — Other Ambulatory Visit: Payer: Self-pay

## 2020-02-02 ENCOUNTER — Encounter: Payer: Self-pay | Admitting: Family Medicine

## 2020-02-02 ENCOUNTER — Inpatient Hospital Stay: Payer: Self-pay | Admitting: Family Medicine

## 2020-02-02 VITALS — BP 133/81 | HR 80 | Temp 98.6°F | Wt 200.4 lb

## 2020-02-02 DIAGNOSIS — N200 Calculus of kidney: Secondary | ICD-10-CM

## 2020-02-02 DIAGNOSIS — R160 Hepatomegaly, not elsewhere classified: Secondary | ICD-10-CM

## 2020-02-02 NOTE — Progress Notes (Signed)
BP 133/81   Pulse 80   Temp 98.6 F (37 C) (Oral)   Wt 200 lb 6.4 oz (90.9 kg)   SpO2 96%   BMI 27.18 kg/m    Subjective:    Patient ID: Nathaniel Young., male    DOB: 1970/12/13, 49 y.o.   MRN: [id number]  HPI: Nathaniel Young. is a 49 y.o. male  Chief Complaint  Patient presents with  . ER Follow Up    patient seen at ER in [place] for a kidney stone on 01/09/20. Started on tamsulosin and given Oxy for pain. Patient states he has not had to take the Oxy recently    ER FOLLOW UP Time since discharge: 3 weeks Hospital/facility: [facility] Diagnosis: Kidney stone, abnormal CT scan of the liver Procedures/tests: CT abdomen/pelvis, labs CT Abdomen Pelvis Without Contrast (Renal Protocol)  Impression Performed by [facility] RAD  -0.5 cm obstructing right renal calculus in the distal ureter with moderate right hydroureteronephrosis and perinephric stranding. Correlate with urinalysis for signs of infection.   -Ill-defined approximately 4.2 cm hepatic segment VI lesion is nonspecific and not well evaluated in the absence of intravenous contrast. Evaluation with nonemergent contrast enhanced MRI of the abdomen is recommended. Narrative Performed by [facility] RAD This result has an attachment that is not available.  EXAM: CT ABDOMEN PELVIS WO CONTRAST  DATE: 01/09/2020 8:43 PM  ACCESSION: [id number]  DICTATED: 01/09/2020 8:45 PM  INTERPRETATION LOCATION: [place]   CLINICAL INDICATION: History of nephrolithiasis, right flank pain ; Flank pain, kidney stone suspected    COMPARISON: None available.   TECHNIQUE: A spiral CT scan of the abdomen and pelvis was obtained without IV contrast from the lung bases through the pubic symphysis. Images were reconstructed in the axial plane. Coronal and sagittal reformatted images were also provided for further evaluation.   FINDINGS:   Evaluation of the solid organs and vasculature is limited in the absence of intravenous contrast.    LOWER THORAX: Unremarkable.   HEPATOBILIARY: Evaluation of ill-defined hypoattenuating hepatic segment VI lesion measuring up to 4.2 x 4.0 x 3.5 cm (4:52, 2:47) is limited in the absence of intravenous contrast. The gallbladder is present and otherwise unremarkable. No biliary dilatation.   SPLEEN: Unremarkable.  PANCREAS: Unremarkable.   ADRENALS: Unremarkable.  KIDNEYS/URETERS: 0.5 cm obstructing right renal calculus in the distal ureter (2:110). Moderate right hydroureteronephrosis with right perinephric stranding. The left kidney and ureter appear unremarkable.   BLADDER: Unremarkable.  PELVIC/REPRODUCTIVE ORGANS: Prostatomegaly measuring up to 5.6 cm with coarse calcifications.Marland Kitchen   GI TRACT: No dilated or thick walled loops of bowel. Colonic diverticulosis without evidence of diverticulitis. Unremarkable appendix.   PERITONEUM/RETROPERITONEUM AND MESENTERY: No free air or fluid.  LYMPH NODES: No enlarged lymph nodes.  VESSELS: Normal in caliber.   BONES AND SOFT TISSUES: No acute osseous abnormality.  Consultants: None New medications: Oxycodone and flomax Discharge instructions: follow up with urology and here   Status: better   Since getting out of the hospital, he has been feeling better. He has not passed a kidney stone yet, but he notes that he is not in pain. He saw urology 2 weeks ago and is scheduled to see them again in the next couple of weeks. He has not been having any pain in his RUQ. No changes in his stools. He is otherwise feeling well.     Relevant past medical, surgical, family and social history reviewed and updated as indicated. Interim medical history since our last  visit reviewed. Allergies and medications reviewed and updated.  Review of Systems  Constitutional: Negative.   Respiratory: Negative.   Cardiovascular: Negative.   Gastrointestinal: Negative.   Genitourinary: Negative.   Psychiatric/Behavioral: Negative.     Per HPI unless  specifically indicated above     Objective:    BP 133/81   Pulse 80   Temp 98.6 F (37 C) (Oral)   Wt 200 lb 6.4 oz (90.9 kg)   SpO2 96%   BMI 27.18 kg/m   Wt Readings from Last 3 Encounters:  02/02/20 200 lb 6.4 oz (90.9 kg)  04/18/18 206 lb (93.4 kg)  08/11/17 204 lb (92.5 kg)    Physical Exam Vitals and nursing note reviewed.  Constitutional:      General: He is not in acute distress.    Appearance: Normal appearance. He is not ill-appearing, toxic-appearing or diaphoretic.  HENT:     Head: Normocephalic and atraumatic.     Right Ear: External ear normal.     Left Ear: External ear normal.     Nose: Nose normal.     Mouth/Throat:     Mouth: Mucous membranes are moist.     Pharynx: Oropharynx is clear.  Eyes:     General: No scleral icterus.       Right eye: No discharge.        Left eye: No discharge.     Extraocular Movements: Extraocular movements intact.     Conjunctiva/sclera: Conjunctivae normal.     Pupils: Pupils are equal, round, and reactive to light.  Cardiovascular:     Rate and Rhythm: Normal rate and regular rhythm.     Pulses: Normal pulses.     Heart sounds: Normal heart sounds. No murmur heard.  No friction rub. No gallop.   Pulmonary:     Effort: Pulmonary effort is normal. No respiratory distress.     Breath sounds: Normal breath sounds. No stridor. No wheezing, rhonchi or rales.  Chest:     Chest wall: No tenderness.  Musculoskeletal:        General: Normal range of motion.     Cervical back: Normal range of motion and neck supple.  Skin:    General: Skin is warm and dry.     Capillary Refill: Capillary refill takes less than 2 seconds.     Coloration: Skin is not jaundiced or pale.     Findings: No bruising, erythema, lesion or rash.  Neurological:     General: No focal deficit present.     Mental Status: He is alert and oriented to person, place, and time. Mental status is at baseline.  Psychiatric:        Mood and Affect: Mood  normal.        Behavior: Behavior normal.        Thought Content: Thought content normal.        Judgment: Judgment normal.     Results for orders placed or performed in visit on 02/02/20  Hepatic function panel  Result Value Ref Range   Total Protein 7.0 6.0 - 8.5 g/dL   Albumin 4.7 4.0 - 5.0 g/dL   Bilirubin Total 0.6 0.0 - 1.2 mg/dL   Bilirubin, Direct 0.13 0.00 - 0.40 mg/dL   Alkaline Phosphatase 61 44 - 121 IU/L   AST 14 0 - 40 IU/L   ALT 30 0 - 44 IU/L      Assessment & Plan:   Problem List Items Addressed This Visit  None    Visit Diagnoses    Liver mass    -  Primary   No insurance right now. Will hold on MRI for right now, but best to get it in the next few months. Will call when he would like to get the MRI. Call w/ concerns   Relevant Orders   Hepatic function panel (Completed)   Kidney stone       Continue flomax. Continue to follow with urology. Call with any concerns. [place] to monitor.    Relevant Medications   oxyCODONE (OXY IR/ROXICODONE) 5 MG immediate release tablet       Follow up plan: Return in about 4 months (around 06/03/2020) for physical.

## 2020-02-03 LAB — HEPATIC FUNCTION PANEL
ALT: 30 IU/L (ref 0–44)
AST: 14 IU/L (ref 0–40)
Albumin: 4.7 g/dL (ref 4.0–5.0)
Alkaline Phosphatase: 61 IU/L (ref 44–121)
Bilirubin Total: 0.6 mg/dL (ref 0.0–1.2)
Bilirubin, Direct: 0.13 mg/dL (ref 0.00–0.40)
Total Protein: 7 g/dL (ref 6.0–8.5)

## 2020-04-10 ENCOUNTER — Other Ambulatory Visit: Payer: Self-pay | Admitting: Family Medicine

## 2020-04-10 ENCOUNTER — Encounter: Payer: Self-pay | Admitting: Family Medicine

## 2020-04-10 DIAGNOSIS — R160 Hepatomegaly, not elsewhere classified: Secondary | ICD-10-CM

## 2020-04-11 ENCOUNTER — Telehealth: Payer: Self-pay | Admitting: Family Medicine

## 2020-04-11 NOTE — Telephone Encounter (Signed)
Patient's wive is calling for [place]. In referral to give insurance.  [facility] [employer] Group Number -[id number]  Tacy Learn LR-[id number]  CB- [phone]

## 2020-04-12 NOTE — Telephone Encounter (Signed)
Can this be added to his chart. I also spoke with patient to see if he will add his insurance card using MyChart.

## 2020-04-22 ENCOUNTER — Ambulatory Visit: Payer: Self-pay

## 2020-04-23 ENCOUNTER — Other Ambulatory Visit: Payer: Self-pay

## 2020-04-23 ENCOUNTER — Ambulatory Visit
Admission: RE | Admit: 2020-04-23 | Discharge: 2020-04-23 | Disposition: A | Discharge status: Home / Self Care | Payer: BC Managed Care – PPO | Source: Ambulatory Visit | Attending: Family Medicine | Admitting: Family Medicine

## 2020-04-23 DIAGNOSIS — R160 Hepatomegaly, not elsewhere classified: Secondary | ICD-10-CM | POA: Diagnosis present

## 2020-04-23 MED ORDER — GADOBUTROL 1 MMOL/ML IV SOLN
9.0000 mL | Freq: Once | INTRAVENOUS | Status: AC | PRN
  Administered 2020-04-23: 9 mL via INTRAVENOUS

## 2020-06-04 ENCOUNTER — Other Ambulatory Visit: Payer: Self-pay

## 2020-06-04 ENCOUNTER — Ambulatory Visit (INDEPENDENT_AMBULATORY_CARE_PROVIDER_SITE_OTHER): Payer: BC Managed Care – PPO | Admitting: Family Medicine

## 2020-06-04 ENCOUNTER — Encounter: Payer: Self-pay | Admitting: Family Medicine

## 2020-06-04 VITALS — BP 136/90 | HR 75 | Temp 98.9°F | Ht 72.5 in | Wt 205.2 lb

## 2020-06-04 DIAGNOSIS — E782 Mixed hyperlipidemia: Secondary | ICD-10-CM

## 2020-06-04 DIAGNOSIS — I10 Essential (primary) hypertension: Secondary | ICD-10-CM | POA: Diagnosis not present

## 2020-06-04 DIAGNOSIS — Z0000 Encounter for general adult medical examination without abnormal findings: Secondary | ICD-10-CM

## 2020-06-04 LAB — URINALYSIS, ROUTINE W REFLEX MICROSCOPIC
Bilirubin, UA: NEGATIVE
Glucose, UA: NEGATIVE
Ketones, UA: NEGATIVE
Nitrite, UA: NEGATIVE
Protein,UA: NEGATIVE
RBC, UA: NEGATIVE
Specific Gravity, UA: >1.03 — ABNORMAL HIGH (ref 1.005–1.030)
Urobilinogen, Ur: 0.2 mg/dL (ref 0.2–1.0)
pH, UA: 5.5 (ref 5.0–7.5)

## 2020-06-04 LAB — MICROSCOPIC EXAMINATION
Epithelial Cells (non renal): NONE SEEN /hpf (ref 0–10)
RBC, Urine: NONE SEEN /hpf (ref 0–2)

## 2020-06-04 LAB — MICROALBUMIN, URINE WAIVED
Creatinine, Urine Waived: 300 mg/dL (ref 10–300)
Microalb, Ur Waived: 80 mg/L — ABNORMAL HIGH (ref 0–19)

## 2020-06-04 NOTE — Patient Instructions (Signed)

## 2020-06-04 NOTE — Progress Notes (Signed)
BP 136/90   Pulse 75   Temp 98.9 F (37.2 C)   Ht 6' 0.5" (1.842 m)   Wt 205 lb 3.2 oz (93.1 kg)   SpO2 100%   BMI 27.45 kg/m    Subjective:    Patient ID: Nathaniel Joy., male    DOB: [birth year], 50 y.o.   MRN: [id number]  HPI: Nathaniel Trentman. is a 50 y.o. male presenting on 06/04/2020 for comprehensive medical examination. Current medical complaints include:none  Interim Problems from his last visit: no  Depression Screen done today and results listed below:  Depression screen [facility] 2/9 06/04/2020 02/02/2020 06/15/2017 05/19/2017 06/12/2016  Decreased Interest 0 0 0 0 0  Down, Depressed, Hopeless 0 0 0 0 0  PHQ - 2 Score 0 0 0 0 0     Past Medical History:  Past Medical History:  Diagnosis Date  . Diverticulosis   . Hyperlipidemia   . Hypertension   . Nephrolithiasis   . Urticaria     Surgical History:  Past Surgical History:  Procedure Laterality Date  . COLONOSCOPY WITH PROPOFOL N/A 01/27/2017   Procedure: COLONOSCOPY WITH PROPOFOL;  Surgeon: Jonathon Bellows, MD;  Location: [facility] ENDOSCOPY;  Service: Gastroenterology;  Laterality: N/A;    Medications:  No current outpatient medications on file prior to visit.   No current facility-administered medications on file prior to visit.    Allergies:  Allergies  Allergen Reactions  . Naproxen Hives  . Robaxin [Methocarbamol] Rash    Social History:  Social History   Socioeconomic History  . Marital status: Married    Spouse name: Not on file  . Number of children: Not on file  . Years of education: Not on file  . Highest education level: Not on file  Occupational History  . Not on file  Tobacco Use  . Smoking status: Never Smoker  . Smokeless tobacco: Never Used  Vaping Use  . Vaping Use: Never used  Substance and Sexual Activity  . Alcohol use: No  . Drug use: No  . Sexual activity: Yes  Other Topics Concern  . Not on file  Social History Narrative  . Not on file   Social Determinants of  Health   Financial Resource Strain: Not on file  Food Insecurity: Not on file  Transportation Needs: Not on file  Physical Activity: Not on file  Stress: Not on file  Social Connections: Not on file  Intimate Partner Violence: Not on file   Social History   Tobacco Use  Smoking Status Never Smoker  Smokeless Tobacco Never Used   Social History   Substance and Sexual Activity  Alcohol Use No    Family History:  Family History  Problem Relation Age of Onset  . Hypertension Mother   . Cancer Father        throat  . COPD Father   . AAA (abdominal aortic aneurysm) Father   . Aneurysm Father        brain    Past medical history, surgical history, medications, allergies, family history and social history reviewed with patient today and changes made to appropriate areas of the chart.   Review of Systems  HENT: Negative.   Eyes: Negative.   Respiratory: Negative.   Cardiovascular: Negative.   Gastrointestinal: Negative.   Genitourinary: Negative.   Musculoskeletal: Negative.   Skin: Negative.   Neurological: Negative.   Endo/Heme/Allergies: Negative.   Psychiatric/Behavioral: Negative.    All other ROS negative except  what is listed above and in the HPI.      Objective:    BP 136/90   Pulse 75   Temp 98.9 F (37.2 C)   Ht 6' 0.5" (1.842 m)   Wt 205 lb 3.2 oz (93.1 kg)   SpO2 100%   BMI 27.45 kg/m   Wt Readings from Last 3 Encounters:  06/04/20 205 lb 3.2 oz (93.1 kg)  02/02/20 200 lb 6.4 oz (90.9 kg)  04/18/18 206 lb (93.4 kg)    Physical Exam Vitals and nursing note reviewed.  Constitutional:      General: He is not in acute distress.    Appearance: Normal appearance. He is normal weight. He is not ill-appearing, toxic-appearing or diaphoretic.  HENT:     Head: Normocephalic and atraumatic.     Right Ear: Tympanic membrane, ear canal and external ear normal. There is no impacted cerumen.     Left Ear: Tympanic membrane, ear canal and external ear  normal. There is no impacted cerumen.     Nose: Nose normal. No congestion or rhinorrhea.     Mouth/Throat:     Mouth: Mucous membranes are moist.     Pharynx: Oropharynx is clear. No oropharyngeal exudate or posterior oropharyngeal erythema.  Eyes:     General: No scleral icterus.       Right eye: No discharge.        Left eye: No discharge.     Extraocular Movements: Extraocular movements intact.     Conjunctiva/sclera: Conjunctivae normal.     Pupils: Pupils are equal, round, and reactive to light.  Neck:     Vascular: No carotid bruit.  Cardiovascular:     Rate and Rhythm: Normal rate and regular rhythm.     Pulses: Normal pulses.     Heart sounds: No murmur heard. No friction rub. No gallop.   Pulmonary:     Effort: Pulmonary effort is normal. No respiratory distress.     Breath sounds: Normal breath sounds. No stridor. No wheezing, rhonchi or rales.  Chest:     Chest wall: No tenderness.  Abdominal:     General: Abdomen is flat. Bowel sounds are normal. There is no distension.     Palpations: Abdomen is soft. There is no mass.     Tenderness: There is no abdominal tenderness. There is no right CVA tenderness, left CVA tenderness, guarding or rebound.     Hernia: No hernia is present.  Genitourinary:    Comments: Genital exam deferred with shared decision making Musculoskeletal:        General: No swelling, tenderness, deformity or signs of injury.     Cervical back: Normal range of motion and neck supple. No rigidity. No muscular tenderness.     Right lower leg: No edema.     Left lower leg: No edema.  Lymphadenopathy:     Cervical: No cervical adenopathy.  Skin:    General: Skin is warm and dry.     Capillary Refill: Capillary refill takes less than 2 seconds.     Coloration: Skin is not jaundiced or pale.     Findings: No bruising, erythema, lesion or rash.  Neurological:     General: No focal deficit present.     Mental Status: He is alert and oriented to person,  place, and time.     Cranial Nerves: No cranial nerve deficit.     Sensory: No sensory deficit.     Motor: No weakness.  Coordination: Coordination normal.     Gait: Gait normal.     Deep Tendon Reflexes: Reflexes normal.  Psychiatric:        Mood and Affect: Mood normal.        Behavior: Behavior normal.        Thought Content: Thought content normal.        Judgment: Judgment normal.     Results for orders placed or performed in visit on 02/02/20  Hepatic function panel  Result Value Ref Range   Total Protein 7.0 6.0 - 8.5 g/dL   Albumin 4.7 4.0 - 5.0 g/dL   Bilirubin Total 0.6 0.0 - 1.2 mg/dL   Bilirubin, Direct 0.13 0.00 - 0.40 mg/dL   Alkaline Phosphatase 61 44 - 121 IU/L   AST 14 0 - 40 IU/L   ALT 30 0 - 44 IU/L      Assessment & Plan:   Problem List Items Addressed This Visit      Other   Hyperlipidemia    Rechecking labs today. Await results. Treat as needed.       Relevant Orders   Comprehensive metabolic panel   Lipid Panel w/o Chol/HDL Ratio    Other Visit Diagnoses    Routine general medical examination at a health care facility    -  Primary   Vaccines up to date. Screening labs checked today. Colonoscopy up to date. Continue diet and exercise. Call with any concerns.    Relevant Orders   Comprehensive metabolic panel   CBC with Differential/Platelet   Lipid Panel w/o Chol/HDL Ratio   PSA   TSH   Urinalysis, Routine w reflex microscopic   Essential hypertension       Doing well off meds. Checking microalbumin. Call with any concerns.    Relevant Orders   Comprehensive metabolic panel   Microalbumin, Urine Waived       Discussed aspirin prophylaxis for myocardial infarction prevention and decision was it was not indicated  LABORATORY TESTING:  Health maintenance labs ordered today as discussed above.   The natural history of prostate cancer and ongoing controversy regarding screening and potential treatment outcomes of prostate cancer has  been discussed with the patient. The meaning of a false positive PSA and a false negative PSA has been discussed. He indicates understanding of the limitations of this screening test and wishes to proceed with screening PSA testing.   IMMUNIZATIONS:   - Tdap: Tetanus vaccination status reviewed: last tetanus booster within 10 years. - Influenza: Refused - Pneumovax: Not applicable - COVID: Refused  SCREENING: - Colonoscopy: Up to date  Discussed with patient purpose of the colonoscopy is to detect colon cancer at curable precancerous or early stages   PATIENT COUNSELING:    Sexuality: Discussed sexually transmitted diseases, partner selection, use of condoms, avoidance of unintended pregnancy  and contraceptive alternatives.   Advised to avoid cigarette smoking.  I discussed with the patient that most people either abstain from alcohol or drink within safe limits (<=14/week and <=4 drinks/occasion for males, <=7/weeks and <= 3 drinks/occasion for females) and that the risk for alcohol disorders and other health effects rises proportionally with the number of drinks per week and how often a drinker exceeds daily limits.  Discussed cessation/primary prevention of drug use and availability of treatment for abuse.   Diet: Encouraged to adjust caloric intake to maintain  or achieve ideal body weight, to reduce intake of dietary saturated fat and total fat, to limit sodium intake by  avoiding high sodium foods and not adding table salt, and to maintain adequate dietary potassium and calcium preferably from fresh fruits, vegetables, and low-fat dairy products.    stressed the importance of regular exercise  Injury prevention: Discussed safety belts, safety helmets, smoke detector, smoking near bedding or upholstery.   Dental health: Discussed importance of regular tooth brushing, flossing, and dental visits.   Follow up plan: NEXT PREVENTATIVE PHYSICAL DUE IN 1 YEAR. Return in about 1 year  (around 06/04/2021), or Physical.

## 2020-06-04 NOTE — Assessment & Plan Note (Signed)
Rechecking labs today. Await results. Treat as needed.  °

## 2020-06-05 LAB — CBC WITH DIFFERENTIAL/PLATELET
Basophils Absolute: 0.1 10*3/uL (ref 0.0–0.2)
Basos: 1 %
EOS (ABSOLUTE): 0.1 10*3/uL (ref 0.0–0.4)
Eos: 2 %
Hematocrit: 48.4 % (ref 37.5–51.0)
Hemoglobin: 16.2 g/dL (ref 13.0–17.7)
Immature Grans (Abs): 0 10*3/uL (ref 0.0–0.1)
Immature Granulocytes: 0 %
Lymphocytes Absolute: 1.9 10*3/uL (ref 0.7–3.1)
Lymphs: 23 %
MCH: 28.8 pg (ref 26.6–33.0)
MCHC: 33.5 g/dL (ref 31.5–35.7)
MCV: 86 fL (ref 79–97)
Monocytes Absolute: 0.6 10*3/uL (ref 0.1–0.9)
Monocytes: 7 %
Neutrophils Absolute: 5.5 10*3/uL (ref 1.4–7.0)
Neutrophils: 67 %
Platelets: 247 10*3/uL (ref 150–450)
RBC: 5.62 x10E6/uL (ref 4.14–5.80)
RDW: 13.1 % (ref 11.6–15.4)
WBC: 8.2 10*3/uL (ref 3.4–10.8)

## 2020-06-05 LAB — TSH: TSH: 0.802 u[IU]/mL (ref 0.450–4.500)

## 2020-06-05 LAB — COMPREHENSIVE METABOLIC PANEL
ALT: 32 IU/L (ref 0–44)
AST: 19 IU/L (ref 0–40)
Albumin/Globulin Ratio: 1.7 (ref 1.2–2.2)
Albumin: 4.5 g/dL (ref 4.0–5.0)
Alkaline Phosphatase: 72 IU/L (ref 44–121)
BUN/Creatinine Ratio: 9 (ref 9–20)
BUN: 11 mg/dL (ref 6–24)
Bilirubin Total: 0.4 mg/dL (ref 0.0–1.2)
CO2: 24 mmol/L (ref 20–29)
Calcium: 9.5 mg/dL (ref 8.7–10.2)
Chloride: 103 mmol/L (ref 96–106)
Creatinine, Ser: 1.25 mg/dL (ref 0.76–1.27)
Globulin, Total: 2.6 g/dL (ref 1.5–4.5)
Glucose: 96 mg/dL (ref 65–99)
Potassium: 4.1 mmol/L (ref 3.5–5.2)
Sodium: 144 mmol/L (ref 134–144)
Total Protein: 7.1 g/dL (ref 6.0–8.5)
eGFR: 70 mL/min/{1.73_m2} (ref >59)

## 2020-06-05 LAB — LIPID PANEL W/O CHOL/HDL RATIO
Cholesterol, Total: 263 mg/dL — ABNORMAL HIGH (ref 100–199)
HDL: 43 mg/dL (ref >39)
LDL Chol Calc (NIH): 167 mg/dL — ABNORMAL HIGH (ref 0–99)
Triglycerides: 281 mg/dL — ABNORMAL HIGH (ref 0–149)
VLDL Cholesterol Cal: 53 mg/dL — ABNORMAL HIGH (ref 5–40)

## 2020-06-05 LAB — PSA: Prostate Specific Ag, Serum: 1.3 ng/mL (ref 0.0–4.0)

## 2020-07-08 ENCOUNTER — Encounter: Payer: Self-pay | Admitting: Family Medicine

## 2021-06-06 ENCOUNTER — Ambulatory Visit (INDEPENDENT_AMBULATORY_CARE_PROVIDER_SITE_OTHER): Payer: BC Managed Care – PPO | Admitting: Family Medicine

## 2021-06-06 ENCOUNTER — Encounter: Payer: Self-pay | Admitting: Family Medicine

## 2021-06-06 ENCOUNTER — Other Ambulatory Visit: Payer: Self-pay

## 2021-06-06 VITALS — BP 130/84 | HR 72 | Temp 98.4°F | Ht 72.0 in | Wt 203.0 lb

## 2021-06-06 DIAGNOSIS — Z0000 Encounter for general adult medical examination without abnormal findings: Secondary | ICD-10-CM | POA: Diagnosis not present

## 2021-06-06 LAB — URINALYSIS, ROUTINE W REFLEX MICROSCOPIC
Bilirubin, UA: NEGATIVE
Glucose, UA: NEGATIVE
Ketones, UA: NEGATIVE
Leukocytes,UA: NEGATIVE
Nitrite, UA: NEGATIVE
Protein,UA: NEGATIVE
RBC, UA: NEGATIVE
Specific Gravity, UA: 1.025 (ref 1.005–1.030)
Urobilinogen, Ur: 1 mg/dL (ref 0.2–1.0)
pH, UA: 6.5 (ref 5.0–7.5)

## 2021-06-06 LAB — MICROALBUMIN, URINE WAIVED
Creatinine, Urine Waived: 300 mg/dL (ref 10–300)
Microalb, Ur Waived: 30 mg/L — ABNORMAL HIGH (ref 0–19)
Microalb/Creat Ratio: <30 mg/g (ref <30)

## 2021-06-06 MED ORDER — CYCLOBENZAPRINE HCL 10 MG PO TABS
10.0000 mg | ORAL_TABLET | Freq: Every day | ORAL | 6 refills | Status: DC
Start: 2021-06-06 — End: 2022-06-08

## 2021-06-06 MED ORDER — DICLOFENAC SODIUM 1 % EX GEL: 4.0000 g | Freq: Four times a day (QID) | CUTANEOUS | 2 refills | Status: DC

## 2021-06-06 MED ORDER — SUMATRIPTAN SUCCINATE 100 MG PO TABS: 100.0000 mg | ORAL_TABLET | ORAL | 12 refills | Status: DC | PRN

## 2021-06-06 NOTE — Progress Notes (Signed)
BP 130/84    Pulse 72    Temp 98.4 F (36.9 C) (Oral)    Ht 6' (1.829 m)    Wt 203 lb (92.1 kg)    SpO2 98%    BMI 27.53 kg/m    Subjective:    Patient ID: Nathaniel Young., male    DOB: 1971-02-04, 51 y.o.   MRN: [id number]  HPI: Nathaniel Young. is a 51 y.o. male presenting on 06/06/2021 for comprehensive medical examination. Current medical complaints include:  HYPERTENSION / HYPERLIPIDEMIA Satisfied with current treatment? yes Duration of hypertension: chronic BP monitoring frequency: not checking BP medication side effects: no Duration of hyperlipidemia: chronic Cholesterol medication side effects: not on anything Cholesterol supplements: none Past cholesterol medications: none Medication compliance: excellent compliance Aspirin: no Recent stressors: no Recurrent headaches: no Visual changes: no Palpitations: no Dyspnea: no Chest pain: no Lower extremity edema: no Dizzy/lightheaded: no  He currently lives with: wife Interim Problems from his last visit: no  Depression Screen done today and results listed below:  Depression screen [facility] 2/9 06/06/2021 06/04/2020 02/02/2020 06/15/2017 05/19/2017  Decreased Interest 0 0 0 0 0  Down, Depressed, Hopeless 0 0 0 0 0  PHQ - 2 Score 0 0 0 0 0  Altered sleeping 0 - - - -  Tired, decreased energy 1 - - - -  Change in appetite 0 - - - -  Feeling bad or failure about yourself  0 - - - -  Trouble concentrating 0 - - - -  Moving slowly or fidgety/restless 0 - - - -  Suicidal thoughts 0 - - - -  PHQ-9 Score 1 - - - -  Difficult doing work/chores Not difficult at all - - - -    Past Medical History:  Past Medical History:  Diagnosis Date   Diverticulosis    Hyperlipidemia    Hypertension    Nephrolithiasis    Urticaria     Surgical History:  Past Surgical History:  Procedure Laterality Date   COLONOSCOPY WITH PROPOFOL N/A 01/27/2017   Procedure: COLONOSCOPY WITH PROPOFOL;  Surgeon: Jonathon Bellows, MD;  Location: [facility]  ENDOSCOPY;  Service: Gastroenterology;  Laterality: N/A;    Medications:  No current outpatient medications on file prior to visit.   No current facility-administered medications on file prior to visit.    Allergies:  Allergies  Allergen Reactions   Naproxen Hives   Robaxin [Methocarbamol] Rash    Social History:  Social History   Socioeconomic History   Marital status: Married    Spouse name: Not on file   Number of children: Not on file   Years of education: Not on file   Highest education level: Not on file  Occupational History   Not on file  Tobacco Use   Smoking status: Never   Smokeless tobacco: Never  Vaping Use   Vaping Use: Never used  Substance and Sexual Activity   Alcohol use: No   Drug use: No   Sexual activity: Yes  Other Topics Concern   Not on file  Social History Narrative   Not on file   Social Determinants of Health   Financial Resource Strain: Not on file  Food Insecurity: Not on file  Transportation Needs: Not on file  Physical Activity: Not on file  Stress: Not on file  Social Connections: Not on file  Intimate Partner Violence: Not on file   Social History   Tobacco Use  Smoking Status  Never  Smokeless Tobacco Never   Social History   Substance and Sexual Activity  Alcohol Use No    Family History:  Family History  Problem Relation Age of Onset   Hypertension Mother    Cancer Father        throat   COPD Father    AAA (abdominal aortic aneurysm) Father    Aneurysm Father        brain    Past medical history, surgical history, medications, allergies, family history and social history reviewed with patient today and changes made to appropriate areas of the chart.   Review of Systems  Constitutional: Negative.   HENT: Negative.    Eyes: Negative.   Respiratory: Negative.    Cardiovascular: Negative.   Gastrointestinal: Negative.   Genitourinary: Negative.   Musculoskeletal:  Positive for joint pain. Negative for  back pain, falls, myalgias and neck pain.  Skin: Negative.   Neurological: Negative.   Endo/Heme/Allergies: Negative.   Psychiatric/Behavioral: Negative.    All other ROS negative except what is listed above and in the HPI.      Objective:    BP 130/84    Pulse 72    Temp 98.4 F (36.9 C) (Oral)    Ht 6' (1.829 m)    Wt 203 lb (92.1 kg)    SpO2 98%    BMI 27.53 kg/m   Wt Readings from Last 3 Encounters:  06/06/21 203 lb (92.1 kg)  06/04/20 205 lb 3.2 oz (93.1 kg)  02/02/20 200 lb 6.4 oz (90.9 kg)    Physical Exam Vitals and nursing note reviewed.  Constitutional:      General: He is not in acute distress.    Appearance: Normal appearance. He is normal weight. He is not ill-appearing, toxic-appearing or diaphoretic.  HENT:     Head: Normocephalic and atraumatic.     Right Ear: Tympanic membrane, ear canal and external ear normal. There is no impacted cerumen.     Left Ear: Tympanic membrane, ear canal and external ear normal. There is no impacted cerumen.     Nose: Nose normal. No congestion or rhinorrhea.     Mouth/Throat:     Mouth: Mucous membranes are moist.     Pharynx: Oropharynx is clear. No oropharyngeal exudate or posterior oropharyngeal erythema.  Eyes:     General: No scleral icterus.       Right eye: No discharge.        Left eye: No discharge.     Extraocular Movements: Extraocular movements intact.     Conjunctiva/sclera: Conjunctivae normal.     Pupils: Pupils are equal, round, and reactive to light.  Neck:     Vascular: No carotid bruit.  Cardiovascular:     Rate and Rhythm: Normal rate and regular rhythm.     Pulses: Normal pulses.     Heart sounds: No murmur heard.   No friction rub. No gallop.  Pulmonary:     Effort: Pulmonary effort is normal. No respiratory distress.     Breath sounds: Normal breath sounds. No stridor. No wheezing, rhonchi or rales.  Chest:     Chest wall: No tenderness.  Abdominal:     General: Abdomen is flat. Bowel sounds are  normal. There is no distension.     Palpations: Abdomen is soft. There is no mass.     Tenderness: There is no abdominal tenderness. There is no right CVA tenderness, left CVA tenderness, guarding or rebound.  Hernia: No hernia is present.  Genitourinary:    Comments: Genital exam deferred with shared decision making Musculoskeletal:        General: No swelling, tenderness, deformity or signs of injury.     Cervical back: Normal range of motion and neck supple. No rigidity. No muscular tenderness.     Right lower leg: No edema.     Left lower leg: No edema.  Lymphadenopathy:     Cervical: No cervical adenopathy.  Skin:    General: Skin is warm and dry.     Capillary Refill: Capillary refill takes less than 2 seconds.     Coloration: Skin is not jaundiced or pale.     Findings: No bruising, erythema, lesion or rash.  Neurological:     General: No focal deficit present.     Mental Status: He is alert and oriented to person, place, and time.     Cranial Nerves: No cranial nerve deficit.     Sensory: No sensory deficit.     Motor: No weakness.     Coordination: Coordination normal.     Gait: Gait normal.     Deep Tendon Reflexes: Reflexes normal.  Psychiatric:        Mood and Affect: Mood normal.        Behavior: Behavior normal.        Thought Content: Thought content normal.        Judgment: Judgment normal.    Results for orders placed or performed in visit on 06/04/20  Microscopic Examination   Urine  Result Value Ref Range   WBC, UA 0-5 0 - 5 /hpf   RBC None seen 0 - 2 /hpf   Epithelial Cells (non renal) None seen 0 - 10 /hpf   Bacteria, UA Few (A) None seen/Few  Comprehensive metabolic panel  Result Value Ref Range   Glucose 96 65 - 99 mg/dL   BUN 11 6 - 24 mg/dL   Creatinine, Ser 1.25 0.76 - 1.27 mg/dL   eGFR 70 >59 mL/min/1.73   BUN/Creatinine Ratio 9 9 - 20   Sodium 144 134 - 144 mmol/L   Potassium 4.1 3.5 - 5.2 mmol/L   Chloride 103 96 - 106 mmol/L   CO2  24 20 - 29 mmol/L   Calcium 9.5 8.7 - 10.2 mg/dL   Total Protein 7.1 6.0 - 8.5 g/dL   Albumin 4.5 4.0 - 5.0 g/dL   Globulin, Total 2.6 1.5 - 4.5 g/dL   Albumin/Globulin Ratio 1.7 1.2 - 2.2   Bilirubin Total 0.4 0.0 - 1.2 mg/dL   Alkaline Phosphatase 72 44 - 121 IU/L   AST 19 0 - 40 IU/L   ALT 32 0 - 44 IU/L  CBC with Differential/Platelet  Result Value Ref Range   WBC 8.2 3.4 - 10.8 x10E3/uL   RBC 5.62 4.14 - 5.80 x10E6/uL   Hemoglobin 16.2 13.0 - 17.7 g/dL   Hematocrit 48.4 37.5 - 51.0 %   MCV 86 79 - 97 fL   MCH 28.8 26.6 - 33.0 pg   MCHC 33.5 31.5 - 35.7 g/dL   RDW 13.1 11.6 - 15.4 %   Platelets 247 150 - 450 x10E3/uL   Neutrophils 67 Not Estab. %   Lymphs 23 Not Estab. %   Monocytes 7 Not Estab. %   Eos 2 Not Estab. %   Basos 1 Not Estab. %   Neutrophils Absolute 5.5 1.4 - 7.0 x10E3/uL   Lymphocytes Absolute 1.9 0.7 - 3.1  x10E3/uL   Monocytes Absolute 0.6 0.1 - 0.9 x10E3/uL   EOS (ABSOLUTE) 0.1 0.0 - 0.4 x10E3/uL   Basophils Absolute 0.1 0.0 - 0.2 x10E3/uL   Immature Granulocytes 0 Not Estab. %   Immature Grans (Abs) 0.0 0.0 - 0.1 x10E3/uL  Lipid Panel w/o Chol/HDL Ratio  Result Value Ref Range   Cholesterol, Total 263 (H) 100 - 199 mg/dL   Triglycerides 281 (H) 0 - 149 mg/dL   HDL 43 >39 mg/dL   VLDL Cholesterol Cal 53 (H) 5 - 40 mg/dL   LDL Chol Calc (NIH) 167 (H) 0 - 99 mg/dL  PSA  Result Value Ref Range   Prostate Specific Ag, Serum 1.3 0.0 - 4.0 ng/mL  TSH  Result Value Ref Range   TSH 0.802 0.450 - 4.500 uIU/mL  Urinalysis, Routine w reflex microscopic  Result Value Ref Range   Specific Gravity, UA >1.030 (H) 1.005 - 1.030   pH, UA 5.5 5.0 - 7.5   Color, UA Yellow Yellow   Appearance Ur Clear Clear   Leukocytes,UA 1+ (A) Negative   Protein,UA Negative Negative/Trace   Glucose, UA Negative Negative   Ketones, UA Negative Negative   RBC, UA Negative Negative   Bilirubin, UA Negative Negative   Urobilinogen, Ur 0.2 0.2 - 1.0 mg/dL   Nitrite, UA  Negative Negative   Microscopic Examination See below:   Microalbumin, Urine Waived  Result Value Ref Range   Microalb, Ur Waived 80 (H) 0 - 19 mg/L   Creatinine, Urine Waived 300 10 - 300 mg/dL   Microalb/Creat Ratio 30-300 (H) <30 mg/g      Assessment & Plan:   Problem List Items Addressed This Visit   None Visit Diagnoses     Routine general medical examination at a health care facility    -  Primary   Vaccines up to date. Screening labs checked today. Colonoscopy due in October. Continue diet and exercise. Call with any concerns.    Relevant Orders   Hepatitis C Antibody   HIV Antibody (routine testing w rflx)   Microalbumin, Urine Waived   Comprehensive metabolic panel   CBC with Differential/Platelet   Lipid Panel w/o Chol/HDL Ratio   PSA   TSH   Urinalysis, Routine w reflex microscopic        LABORATORY TESTING:  Health maintenance labs ordered today as discussed above.   The natural history of prostate cancer and ongoing controversy regarding screening and potential treatment outcomes of prostate cancer has been discussed with the patient. The meaning of a false positive PSA and a false negative PSA has been discussed. He indicates understanding of the limitations of this screening test and wishes to proceed with screening PSA testing.   IMMUNIZATIONS:   - Tdap: Tetanus vaccination status reviewed: last tetanus booster within 10 years. - Influenza: Refused - Pneumovax: Not applicable - Prevnar: Not applicable - COVID: Refused - HPV: Not applicable - Shingrix vaccine: Refused  SCREENING: - Colonoscopy: Up to date  Discussed with patient purpose of the colonoscopy is to detect colon cancer at curable precancerous or early stages   PATIENT COUNSELING:    Sexuality: Discussed sexually transmitted diseases, partner selection, use of condoms, avoidance of unintended pregnancy  and contraceptive alternatives.   Advised to avoid cigarette smoking.  I discussed  with the patient that most people either abstain from alcohol or drink within safe limits (<=14/week and <=4 drinks/occasion for males, <=7/weeks and <= 3 drinks/occasion for females) and that the risk  for alcohol disorders and other health effects rises proportionally with the number of drinks per week and how often a drinker exceeds daily limits.  Discussed cessation/primary prevention of drug use and availability of treatment for abuse.   Diet: Encouraged to adjust caloric intake to maintain  or achieve ideal body weight, to reduce intake of dietary saturated fat and total fat, to limit sodium intake by avoiding high sodium foods and not adding table salt, and to maintain adequate dietary potassium and calcium preferably from fresh fruits, vegetables, and low-fat dairy products.    stressed the importance of regular exercise  Injury prevention: Discussed safety belts, safety helmets, smoke detector, smoking near bedding or upholstery.   Dental health: Discussed importance of regular tooth brushing, flossing, and dental visits.   Follow up plan: NEXT PREVENTATIVE PHYSICAL DUE IN 1 YEAR. Return in about 1 year (around 06/07/2022) for physical.

## 2021-06-07 LAB — CBC WITH DIFFERENTIAL/PLATELET
Basophils Absolute: 0.1 10*3/uL (ref 0.0–0.2)
Basos: 1 %
EOS (ABSOLUTE): 0.1 10*3/uL (ref 0.0–0.4)
Eos: 2 %
Hematocrit: 49.4 % (ref 37.5–51.0)
Hemoglobin: 16.5 g/dL (ref 13.0–17.7)
Immature Grans (Abs): 0 10*3/uL (ref 0.0–0.1)
Immature Granulocytes: 0 %
Lymphocytes Absolute: 1.5 10*3/uL (ref 0.7–3.1)
Lymphs: 28 %
MCH: 28.8 pg (ref 26.6–33.0)
MCHC: 33.4 g/dL (ref 31.5–35.7)
MCV: 86 fL (ref 79–97)
Monocytes Absolute: 0.4 10*3/uL (ref 0.1–0.9)
Monocytes: 7 %
Neutrophils Absolute: 3.2 10*3/uL (ref 1.4–7.0)
Neutrophils: 62 %
Platelets: 224 10*3/uL (ref 150–450)
RBC: 5.72 x10E6/uL (ref 4.14–5.80)
RDW: 13.2 % (ref 11.6–15.4)
WBC: 5.2 10*3/uL (ref 3.4–10.8)

## 2021-06-07 LAB — COMPREHENSIVE METABOLIC PANEL
ALT: 15 IU/L (ref 0–44)
AST: 16 IU/L (ref 0–40)
Albumin/Globulin Ratio: 1.8 (ref 1.2–2.2)
Albumin: 4.8 g/dL (ref 3.8–4.9)
Alkaline Phosphatase: 66 IU/L (ref 44–121)
BUN/Creatinine Ratio: 13 (ref 9–20)
BUN: 14 mg/dL (ref 6–24)
Bilirubin Total: 0.6 mg/dL (ref 0.0–1.2)
CO2: 23 mmol/L (ref 20–29)
Calcium: 9.4 mg/dL (ref 8.7–10.2)
Chloride: 102 mmol/L (ref 96–106)
Creatinine, Ser: 1.07 mg/dL (ref 0.76–1.27)
Globulin, Total: 2.6 g/dL (ref 1.5–4.5)
Glucose: 102 mg/dL — ABNORMAL HIGH (ref 70–99)
Potassium: 4.5 mmol/L (ref 3.5–5.2)
Sodium: 141 mmol/L (ref 134–144)
Total Protein: 7.4 g/dL (ref 6.0–8.5)
eGFR: 84 mL/min/{1.73_m2} (ref >59)

## 2021-06-07 LAB — HEPATITIS C ANTIBODY: Hep C Virus Ab: NONREACTIVE

## 2021-06-07 LAB — PSA: Prostate Specific Ag, Serum: 1.1 ng/mL (ref 0.0–4.0)

## 2021-06-07 LAB — LIPID PANEL W/O CHOL/HDL RATIO
Cholesterol, Total: 256 mg/dL — ABNORMAL HIGH (ref 100–199)
HDL: 44 mg/dL (ref >39)
LDL Chol Calc (NIH): 176 mg/dL — ABNORMAL HIGH (ref 0–99)
Triglycerides: 195 mg/dL — ABNORMAL HIGH (ref 0–149)
VLDL Cholesterol Cal: 36 mg/dL (ref 5–40)

## 2021-06-07 LAB — HIV ANTIBODY (ROUTINE TESTING W REFLEX): HIV Screen 4th Generation wRfx: NONREACTIVE

## 2021-06-07 LAB — TSH: TSH: 0.846 u[IU]/mL (ref 0.450–4.500)

## 2022-01-08 ENCOUNTER — Ambulatory Visit: Payer: Self-pay | Admitting: *Deleted

## 2022-01-08 NOTE — Telephone Encounter (Signed)
  Chief Complaint: Hypertension Symptoms: BP yesterday 150/90 Pt was at dentist. Increased fatigue 1-2/1- chest tightness at times, does not occur daily. When occurs, mostly in evenings, duration of few minutes. Does not have presently. Does not check BP at home, no meds. Frequency: yesterday Pertinent Negatives: Patient denies SOB Disposition: '[]'$ ED /'[]'$ Urgent Care (no appt availability in office) / '[]'$ Appointment(In office/virtual)/ '[]'$  [facility]/ '[]'$ Home Care/ '[]'$ Refused Recommended Disposition /'[]'$ [facility]/ '[]'$  Follow-up with PCP Additional Notes: Appt secured for tomorrow. Care advise provided, verbalizes understanding.  Reason for Disposition  Systolic BP  >= 737 OR Diastolic >= 106    BP 269/48, occasional chest tightness, not presently, not daily.  Answer Assessment - Initial Assessment Questions 1. BLOOD PRESSURE: "What is the blood pressure?" "Did you take at least two measurements 5 minutes apart?"     No 2. ONSET: "When did you take your blood pressure?"     150/90 yesterday at dentist office 3. HOW: "How did you take your blood pressure?" (e.g., automatic home BP monitor, visiting nurse)     Dentist 4. HISTORY: "Do you have a history of high blood pressure?"     No 5. MEDICINES: "Are you taking any medicines for blood pressure?" "Have you missed any doses recently?"     No 6. OTHER SYMPTOMS: "Do you have any symptoms?" (e.g., blurred vision, chest pain, difficulty breathing, headache, weakness) Increased fatigue, night sweats at times, chest tightness at times, does not occur daily, sometimes in evening 1-2/10 when occurs.  Protocols used: Blood Pressure - High-A-AH

## 2022-01-09 ENCOUNTER — Encounter: Payer: Self-pay | Admitting: Family Medicine

## 2022-01-09 ENCOUNTER — Ambulatory Visit: Payer: BC Managed Care – PPO | Admitting: Family Medicine

## 2022-01-09 VITALS — BP 132/86 | HR 63 | Temp 98.1°F | Wt 201.6 lb

## 2022-01-09 DIAGNOSIS — R0789 Other chest pain: Secondary | ICD-10-CM | POA: Diagnosis not present

## 2022-01-09 DIAGNOSIS — R61 Generalized hyperhidrosis: Secondary | ICD-10-CM

## 2022-01-09 DIAGNOSIS — R030 Elevated blood-pressure reading, without diagnosis of hypertension: Secondary | ICD-10-CM

## 2022-01-09 LAB — MICROALBUMIN, URINE WAIVED
Creatinine, Urine Waived: 300 mg/dL (ref 10–300)
Microalb, Ur Waived: 10 mg/L (ref 0–19)
Microalb/Creat Ratio: <30 mg/g (ref <30)

## 2022-01-09 NOTE — Progress Notes (Signed)
BP 132/86   Pulse 63   Temp 98.1 F (36.7 C)   Wt 201 lb 9.6 oz (91.4 kg)   SpO2 98%   BMI 27.34 kg/m    Subjective:    Patient ID: Nathaniel Young., male    DOB: 1970-04-12, 51 y.o.   MRN: [id number]  HPI: Nathaniel Young. is a 51 y.o. male  Chief Complaint  Patient presents with   Hypertension    Patient states he was at the dentist twice this week and it was slightly high in the 150's.   Night Sweats    Patient states he has night sweats about 2-3 nights a week for the past 2-3 weeks.    Fatigue    Patient states he feels tired sometimes and sometimes gets chest tightness, no SOB. Patient states chest tightness comes and goes feels like a 1 on a pain scale of 1-10   ELEVATED BLOOD PRESSURE Duration of elevated BP: unknown BP monitoring frequency:  at the dentist 2x BP range: 150s Previous BP meds: no Recent stressors: no Family history of hypertension: yes Recurrent headaches: no Visual changes: no Palpitations: no  Dyspnea: no Chest pain: no Lower extremity edema: no Dizzy/lightheaded: no Transient ischemic attacks: no  CHEST TIGHTNESS Duration: 2-3 weeks Onset:  unsure Quality: tightness Severity: mild Location: substernal Radiation: none Episode duration: hours Frequency: intermittent Related to exertion: no Trauma: no Anxiety/recent stressors: no Status: stable Treatments attempted: ibuprofen and antacids  Current pain status: in pain Shortness of breath: no Cough: no Nausea: no Diaphoresis: no Heartburn: no Palpitations: no  FATIGUE Duration:   2-3 weeks Severity: moderate  Onset: sudden Context when symptoms started:  unknown Symptoms improve with rest: yes  Depressive symptoms: no Stress/anxiety: no Insomnia: no  Snoring: no Observed apnea by bed partner: no Daytime hypersomnolence:yes Wakes feeling refreshed: yes History of sleep study: no Dysnea on exertion:  no Orthopnea/PND: no Chest pain: yes Chronic cough:  no Lower extremity edema: no Arthralgias:yes Myalgias: yes Weakness: no Rash: no  Relevant past medical, surgical, family and social history reviewed and updated as indicated. Interim medical history since our last visit reviewed. Allergies and medications reviewed and updated.  Review of Systems  Constitutional:  Positive for diaphoresis and fatigue. Negative for activity change, appetite change, chills, fever and unexpected weight change.  HENT: Negative.    Respiratory:  Positive for chest tightness. Negative for apnea, cough, choking, shortness of breath, wheezing and stridor.   Cardiovascular: Negative.   Gastrointestinal: Negative.   Musculoskeletal: Negative.   Neurological: Negative.   Psychiatric/Behavioral: Negative.      Per HPI unless specifically indicated above     Objective:    BP 132/86   Pulse 63   Temp 98.1 F (36.7 C)   Wt 201 lb 9.6 oz (91.4 kg)   SpO2 98%   BMI 27.34 kg/m   Wt Readings from Last 3 Encounters:  01/09/22 201 lb 9.6 oz (91.4 kg)  06/06/21 203 lb (92.1 kg)  06/04/20 205 lb 3.2 oz (93.1 kg)    Physical Exam Vitals and nursing note reviewed.  Constitutional:      General: He is not in acute distress.    Appearance: Normal appearance. He is normal weight. He is not ill-appearing, toxic-appearing or diaphoretic.  HENT:     Head: Normocephalic and atraumatic.     Right Ear: External ear normal.     Left Ear: External ear normal.     Nose: Nose  normal.     Mouth/Throat:     Mouth: Mucous membranes are moist.     Pharynx: Oropharynx is clear.  Eyes:     General: No scleral icterus.       Right eye: No discharge.        Left eye: No discharge.     Extraocular Movements: Extraocular movements intact.     Conjunctiva/sclera: Conjunctivae normal.     Pupils: Pupils are equal, round, and reactive to light.  Cardiovascular:     Rate and Rhythm: Normal rate and regular rhythm.     Pulses: Normal pulses.     Heart sounds: Normal heart  sounds. No murmur heard.    No friction rub. No gallop.  Pulmonary:     Effort: Pulmonary effort is normal. No respiratory distress.     Breath sounds: Normal breath sounds. No stridor. No wheezing, rhonchi or rales.  Chest:     Chest wall: No tenderness.  Musculoskeletal:        General: Normal range of motion.     Cervical back: Normal range of motion and neck supple.  Skin:    General: Skin is warm and dry.     Capillary Refill: Capillary refill takes less than 2 seconds.     Coloration: Skin is not jaundiced or pale.     Findings: No bruising, erythema, lesion or rash.  Neurological:     General: No focal deficit present.     Mental Status: He is alert and oriented to person, place, and time. Mental status is at baseline.  Psychiatric:        Mood and Affect: Mood normal.        Behavior: Behavior normal.        Thought Content: Thought content normal.        Judgment: Judgment normal.     Results for orders placed or performed in visit on 06/06/21  Hepatitis C Antibody  Result Value Ref Range   Hep C Virus Ab Non Reactive Non Reactive  HIV Antibody (routine testing w rflx)  Result Value Ref Range   HIV Screen 4th Generation wRfx Non Reactive Non Reactive  Microalbumin, Urine Waived  Result Value Ref Range   Microalb, Ur Waived 30 (H) 0 - 19 mg/L   Creatinine, Urine Waived 300 10 - 300 mg/dL   Microalb/Creat Ratio <30 <30 mg/g  Comprehensive metabolic panel  Result Value Ref Range   Glucose 102 (H) 70 - 99 mg/dL   BUN 14 6 - 24 mg/dL   Creatinine, Ser 1.07 0.76 - 1.27 mg/dL   eGFR 84 >59 mL/min/1.73   BUN/Creatinine Ratio 13 9 - 20   Sodium 141 134 - 144 mmol/L   Potassium 4.5 3.5 - 5.2 mmol/L   Chloride 102 96 - 106 mmol/L   CO2 23 20 - 29 mmol/L   Calcium 9.4 8.7 - 10.2 mg/dL   Total Protein 7.4 6.0 - 8.5 g/dL   Albumin 4.8 3.8 - 4.9 g/dL   Globulin, Total 2.6 1.5 - 4.5 g/dL   Albumin/Globulin Ratio 1.8 1.2 - 2.2   Bilirubin Total 0.6 0.0 - 1.2 mg/dL    Alkaline Phosphatase 66 44 - 121 IU/L   AST 16 0 - 40 IU/L   ALT 15 0 - 44 IU/L  CBC with Differential/Platelet  Result Value Ref Range   WBC 5.2 3.4 - 10.8 x10E3/uL   RBC 5.72 4.14 - 5.80 x10E6/uL   Hemoglobin 16.5 13.0 - 17.7 g/dL  Hematocrit 49.4 37.5 - 51.0 %   MCV 86 79 - 97 fL   MCH 28.8 26.6 - 33.0 pg   MCHC 33.4 31.5 - 35.7 g/dL   RDW 13.2 11.6 - 15.4 %   Platelets 224 150 - 450 x10E3/uL   Neutrophils 62 Not Estab. %   Lymphs 28 Not Estab. %   Monocytes 7 Not Estab. %   Eos 2 Not Estab. %   Basos 1 Not Estab. %   Neutrophils Absolute 3.2 1.4 - 7.0 x10E3/uL   Lymphocytes Absolute 1.5 0.7 - 3.1 x10E3/uL   Monocytes Absolute 0.4 0.1 - 0.9 x10E3/uL   EOS (ABSOLUTE) 0.1 0.0 - 0.4 x10E3/uL   Basophils Absolute 0.1 0.0 - 0.2 x10E3/uL   Immature Granulocytes 0 Not Estab. %   Immature Grans (Abs) 0.0 0.0 - 0.1 x10E3/uL  Lipid Panel w/o Chol/HDL Ratio  Result Value Ref Range   Cholesterol, Total 256 (H) 100 - 199 mg/dL   Triglycerides 195 (H) 0 - 149 mg/dL   HDL 44 >39 mg/dL   VLDL Cholesterol Cal 36 5 - 40 mg/dL   LDL Chol Calc (NIH) 176 (H) 0 - 99 mg/dL  PSA  Result Value Ref Range   Prostate Specific Ag, Serum 1.1 0.0 - 4.0 ng/mL  TSH  Result Value Ref Range   TSH 0.846 0.450 - 4.500 uIU/mL  Urinalysis, Routine w reflex microscopic  Result Value Ref Range   Specific Gravity, UA 1.025 1.005 - 1.030   pH, UA 6.5 5.0 - 7.5   Color, UA Yellow Yellow   Appearance Ur Clear Clear   Leukocytes,UA Negative Negative   Protein,UA Negative Negative/Trace   Glucose, UA Negative Negative   Ketones, UA Negative Negative   RBC, UA Negative Negative   Bilirubin, UA Negative Negative   Urobilinogen, Ur 1.0 0.2 - 1.0 mg/dL   Nitrite, UA Negative Negative      Assessment & Plan:   Problem List Items Addressed This Visit   None Visit Diagnoses     Chest tightness    -  Primary   EKG normal. Will check labs and microalbumin. Await results. Treat as needed.    Relevant  Orders   EKG 12-Lead (Completed)   Night sweats       Concern for tick diseases. Will check labs. Await results. Treat as needed.    Relevant Orders   Comprehensive metabolic panel   CBC with Differential/Platelet   QuantiFERON-TB Gold Plus   TSH   Lyme Disease Serology w/Reflex   Babesia microti Antibody Panel   Ehrlichia Antibody Panel   Rocky mtn spotted fvr abs pnl(IgG+IgM)   Elevated blood-pressure reading without diagnosis of hypertension       Normal BP today. Will check microalbumin. May need low dose lisinopril for renal protection.    Relevant Orders   Microalbumin, Urine Waived        Follow up plan: Return if symptoms worsen or fail to improve.

## 2022-01-15 LAB — CBC WITH DIFFERENTIAL/PLATELET
Basophils Absolute: 0 10*3/uL (ref 0.0–0.2)
Basos: 1 %
EOS (ABSOLUTE): 0.1 10*3/uL (ref 0.0–0.4)
Eos: 1 %
Hematocrit: 47.3 % (ref 37.5–51.0)
Hemoglobin: 15.9 g/dL (ref 13.0–17.7)
Immature Grans (Abs): 0 10*3/uL (ref 0.0–0.1)
Immature Granulocytes: 0 %
Lymphocytes Absolute: 1.9 10*3/uL (ref 0.7–3.1)
Lymphs: 31 %
MCH: 29.2 pg (ref 26.6–33.0)
MCHC: 33.6 g/dL (ref 31.5–35.7)
MCV: 87 fL (ref 79–97)
Monocytes Absolute: 0.5 10*3/uL (ref 0.1–0.9)
Monocytes: 7 %
Neutrophils Absolute: 3.7 10*3/uL (ref 1.4–7.0)
Neutrophils: 60 %
Platelets: 216 10*3/uL (ref 150–450)
RBC: 5.45 x10E6/uL (ref 4.14–5.80)
RDW: 13.1 % (ref 11.6–15.4)
WBC: 6.2 10*3/uL (ref 3.4–10.8)

## 2022-01-15 LAB — LYME DISEASE SEROLOGY W/REFLEX: Lyme Total Antibody EIA: NEGATIVE

## 2022-01-15 LAB — COMPREHENSIVE METABOLIC PANEL
ALT: 20 IU/L (ref 0–44)
AST: 19 IU/L (ref 0–40)
Albumin/Globulin Ratio: 2.1 (ref 1.2–2.2)
Albumin: 4.9 g/dL (ref 3.8–4.9)
Alkaline Phosphatase: 67 IU/L (ref 44–121)
BUN/Creatinine Ratio: 14 (ref 9–20)
BUN: 18 mg/dL (ref 6–24)
Bilirubin Total: 0.5 mg/dL (ref 0.0–1.2)
CO2: 24 mmol/L (ref 20–29)
Calcium: 9.8 mg/dL (ref 8.7–10.2)
Chloride: 101 mmol/L (ref 96–106)
Creatinine, Ser: 1.26 mg/dL (ref 0.76–1.27)
Globulin, Total: 2.3 g/dL (ref 1.5–4.5)
Glucose: 87 mg/dL (ref 70–99)
Potassium: 4.6 mmol/L (ref 3.5–5.2)
Sodium: 140 mmol/L (ref 134–144)
Total Protein: 7.2 g/dL (ref 6.0–8.5)
eGFR: 69 mL/min/{1.73_m2} (ref >59)

## 2022-01-15 LAB — EHRLICHIA ANTIBODY PANEL
E. Chaffeensis (HME) IgM Titer: NEGATIVE
E.Chaffeensis (HME) IgG: NEGATIVE
HGE IgG Titer: NEGATIVE
HGE IgM Titer: NEGATIVE

## 2022-01-15 LAB — BABESIA MICROTI ANTIBODY PANEL
Babesia microti IgG: <1:10 {titer}
Babesia microti IgM: <1:10 {titer}

## 2022-01-15 LAB — TSH: TSH: 0.985 u[IU]/mL (ref 0.450–4.500)

## 2022-01-15 LAB — QUANTIFERON-TB GOLD PLUS
QuantiFERON Mitogen Value: >10 IU/mL
QuantiFERON Nil Value: 0 IU/mL
QuantiFERON TB1 Ag Value: 0.01 IU/mL
QuantiFERON TB2 Ag Value: 0 IU/mL
QuantiFERON-TB Gold Plus: NEGATIVE

## 2022-01-15 LAB — ROCKY MTN SPOTTED FVR ABS PNL(IGG+IGM)
RMSF IgG: NEGATIVE
RMSF IgM: 0.33 index (ref 0.00–0.89)

## 2022-01-15 NOTE — Progress Notes (Signed)
Interpreted by me on 01/09/22. NSR at 62bpm, no ST segment changes

## 2022-01-20 ENCOUNTER — Ambulatory Visit: Payer: BC Managed Care – PPO | Admitting: Physician Assistant

## 2022-01-20 ENCOUNTER — Ambulatory Visit
Admission: RE | Admit: 2022-01-20 | Discharge: 2022-01-20 | Disposition: A | Discharge status: Home / Self Care | Payer: BC Managed Care – PPO | Source: Ambulatory Visit | Attending: Physician Assistant | Admitting: Physician Assistant

## 2022-01-20 ENCOUNTER — Ambulatory Visit: Payer: Self-pay

## 2022-01-20 ENCOUNTER — Encounter: Payer: Self-pay | Admitting: Physician Assistant

## 2022-01-20 ENCOUNTER — Ambulatory Visit
Admission: RE | Admit: 2022-01-20 | Discharge: 2022-01-20 | Disposition: A | Discharge status: Home / Self Care | Payer: BC Managed Care – PPO | Attending: Physician Assistant | Admitting: Physician Assistant

## 2022-01-20 VITALS — BP 157/102 | HR 71 | Temp 99.2°F | Ht 72.01 in | Wt 202.9 lb

## 2022-01-20 DIAGNOSIS — R10A0 Flank pain, unspecified side: Secondary | ICD-10-CM

## 2022-01-20 DIAGNOSIS — R109 Unspecified abdominal pain: Secondary | ICD-10-CM

## 2022-01-20 DIAGNOSIS — R319 Hematuria, unspecified: Secondary | ICD-10-CM | POA: Diagnosis not present

## 2022-01-20 DIAGNOSIS — N200 Calculus of kidney: Secondary | ICD-10-CM

## 2022-01-20 LAB — URINALYSIS, ROUTINE W REFLEX MICROSCOPIC
Bilirubin, UA: NEGATIVE
Glucose, UA: NEGATIVE
Leukocytes,UA: NEGATIVE
Nitrite, UA: NEGATIVE
Specific Gravity, UA: 1.02 (ref 1.005–1.030)
Urobilinogen, Ur: 1 mg/dL (ref 0.2–1.0)
pH, UA: 7.5 (ref 5.0–7.5)

## 2022-01-20 LAB — MICROSCOPIC EXAMINATION
Epithelial Cells (non renal): NONE SEEN /hpf (ref 0–10)
WBC, UA: NONE SEEN /hpf (ref 0–5)

## 2022-01-20 MED ORDER — TAMSULOSIN HCL 0.4 MG PO CAPS: 0.4000 mg | ORAL_CAPSULE | Freq: Every day | ORAL | 0 refills | Status: DC

## 2022-01-20 NOTE — Progress Notes (Unsigned)
          Acute Office Visit   Patient: Nathaniel Young.   DOB: Oct 08, 1970   51 y.o. Male  MRN: [id number] Visit Date: 01/20/2022  Today's healthcare provider: Dani Gobble Caidence Kaseman, PA-C  Introduced myself to the patient as a Journalist, newspaper and provided education on APPs in clinical practice.    Chief Complaint  Patient presents with   Back Pain    Low abx pain, blood in urine since Friday.   Subjective    Back Pain Associated symptoms include abdominal pain. Pertinent negatives include no dysuria or fever.   HPI     Back Pain    Additional comments: Low abx pain, blood in urine since Friday.      Last edited by Jerelene Redden, CMA on 01/20/2022  1:47 PM.      Concern for kidney stone  Reports he is having abdominal pain and low back pain  Reports cramping in lower abdomen and achy flank pain Report blood in his urine States this has been ongoing since Friday  He states he has had kidney stones in the past     Medications: Outpatient Medications Prior to Visit  Medication Sig   cyclobenzaprine (FLEXERIL) 10 MG tablet Take 1 tablet (10 mg total) by mouth at bedtime.   SUMAtriptan (IMITREX) 100 MG tablet Take 1 tablet (100 mg total) by mouth every 2 (two) hours as needed for migraine. May repeat in 2 hours if headache persists or recurs.   No facility-administered medications prior to visit.    Review of Systems  Constitutional:  Positive for diaphoresis (night sweats). Negative for chills and fever.  Gastrointestinal:  Positive for abdominal pain.  Genitourinary:  Positive for flank pain and hematuria. Negative for difficulty urinating, dysuria and enuresis.  Musculoskeletal:  Positive for back pain.    {Labs  Heme  Chem  Endocrine  Serology  Results Review (optional):23779}   Objective    BP (!) 157/102   Pulse 71   Temp 99.2 F (37.3 C) (Oral)   Ht 6' 0.01" (1.829 m)   Wt 202 lb 14.4 oz (92 kg)   SpO2 97%   BMI 27.51 kg/m  {Show previous vital signs  (optional):23777}  Physical Exam Vitals reviewed.  Constitutional:      Appearance: Normal appearance.  HENT:     Head: Normocephalic and atraumatic.  Neurological:     Mental Status: He is alert.       No results found for any visits on 01/20/22.  Assessment & Plan      No follow-ups on file.

## 2022-01-20 NOTE — Patient Instructions (Addendum)
Please go to [facility] for your xray  We will keep you updated on the results once they are available and send in any medications that are indicated     Your blood pressure was mildly elevated today.  If possible please take it at home using an electronic blood pressure cuff for the upper arm Record your blood pressure once per day and bring them back with you to your apt so we can make sure you are not developing high blood pressure.   Incorporating a minimum of 150 minutes (20-30 minutes per day) of moderate intensity physical activity can help improve your heart health and reduce the chances of high blood pressure and other cardiovascular risks. Incorporating a heart healthy diet can also help reduce the chances of heart attack and high cholesterol.

## 2022-01-20 NOTE — Telephone Encounter (Signed)
  Chief Complaint: blood in urine intermittently- urine looks tea colored Symptoms: flank tenderness, lower back pain, pelvic cramping, urinary frequency Frequency: last  Friday Pertinent Negatives: Patient denies fever, burning with urination, vomiting Disposition: '[]'$ ED /'[]'$ Urgent Care (no appt availability in office) / '[x]'$ Appointment(In office/virtual)/ '[]'$  [place] [facility]/ '[]'$ Home Care/ '[]'$ Refused Recommended Disposition /'[]'$ [place] [facility]/ '[]'$  Follow-up with PCP Additional Notes: advised to drink at least 6-8 glasses of water per day.  Reason for Disposition  Side (flank) or back pain present  Answer Assessment - Initial Assessment Questions 1. COLOR of URINE: "Describe the color of the urine."  (e.g., tea-colored, pink, red, bloody) "Do you have blood clots in your urine?" (e.g., none, pea, grape, small coin)     Tea almost brown - no clots 2. ONSET: "When did the bleeding start?"      Last Friday 3. EPISODES: "How many times has there been blood in the urine?" or "How many times today?"     On and off since last Friday 4. PAIN with URINATION: "Is there any pain with passing your urine?" If Yes, ask: "How bad is the pain?"  (Scale 1-10; or mild, moderate, severe)    - MILD: Complains slightly about urination hurting.    - MODERATE: Interferes with normal activities.      - SEVERE: Excruciating, unwilling or unable to urinate because of the pain.      no 5. FEVER: "Do you have a fever?" If Yes, ask: "What is your temperature, how was it measured, and when did it start?"     Night sweats 6. ASSOCIATED SYMPTOMS: "Are you passing urine more frequently than usual?"     yes 7. OTHER SYMPTOMS: "Do you have any other symptoms?" (e.g., back/flank pain, abdomen pain, vomiting)     Back pain, bladder cramping radiating to back- flank tenderness 8. PREGNANCY: "Is there any chance you are pregnant?" "When was your last menstrual period?"     N/a  Protocols used: Urine - Blood  In-A-AH

## 2022-01-21 DIAGNOSIS — R319 Hematuria, unspecified: Secondary | ICD-10-CM | POA: Insufficient documentation

## 2022-01-21 DIAGNOSIS — R109 Unspecified abdominal pain: Secondary | ICD-10-CM | POA: Insufficient documentation

## 2022-01-21 DIAGNOSIS — N200 Calculus of kidney: Secondary | ICD-10-CM | POA: Insufficient documentation

## 2022-01-21 NOTE — Assessment & Plan Note (Signed)
See Kidney stone A&P

## 2022-01-21 NOTE — Assessment & Plan Note (Addendum)
Acute, recurrent Patient reports hematuria, flank pain, and colicky abdominal pain since Friday  He has a hx of kidney stones but reports this feels different UA was positive for 3+ RBC and trace ketones Due to hematuria will send for culture to rule out potential UTI Suspected kidney stone seen with KUB : FINDINGS: Nonobstructed gas pattern. Faint 4 mm calcification in the left paraspinal region at approximate inferior L1 level. IMPRESSION: 1. Possible faint 4 mm calcification in the region of left UPJ. Follow-up CT KUB may be obtained as clinically indicated Will start course of Flomax to assist with passage and recommend staying well hydrated Will refer to Urology for persistent or progressing symptoms  Follow up as needed

## 2022-01-21 NOTE — Assessment & Plan Note (Signed)
See kidney stone A&P

## 2022-01-22 LAB — URINE CULTURE: Organism ID, Bacteria: NO GROWTH

## 2022-01-27 ENCOUNTER — Other Ambulatory Visit: Payer: Self-pay | Admitting: Physician Assistant

## 2022-01-27 DIAGNOSIS — N200 Calculus of kidney: Secondary | ICD-10-CM

## 2022-01-28 NOTE — Telephone Encounter (Signed)
Requested medication (s) are due for refill today: yes for 90  Requested medication (s) are on the active medication list: yes  Last refill:  01/20/22  Future visit scheduled: yes  Notes to clinic:  Kenn[place]. DX Code Needed.     Requested Prescriptions  Pending Prescriptions Disp Refills   tamsulosin (FLOMAX) 0.4 MG CAPS capsule [Pharmacy Med Name: TAMSULOSIN HCL 0.4 MG CAPSULE] 90 capsule 1    Sig: TAKE 1 CAPSULE BY MOUTH EVERY DAY     Urology: Alpha-Adrenergic Blocker Failed - 01/27/2022  2:32 PM      Failed - Last BP in normal range    BP Readings from Last 1 Encounters:  01/20/22 (!) 157/102         Passed - PSA in normal range and within 360 days    Prostate Specific Ag, Serum  Date Value Ref Range Status  06/06/2021 1.1 0.0 - 4.0 ng/mL Final    Comment:    Roche ECLIA methodology. According to the American Urological Association, Serum PSA should decrease and remain at undetectable levels after radical prostatectomy. The AUA defines biochemical recurrence as an initial PSA value 0.2 ng/mL or greater followed by a subsequent confirmatory PSA value 0.2 ng/mL or greater. Values obtained with different assay methods or kits cannot be used interchangeably. Results cannot be interpreted as absolute evidence of the presence or absence of malignant disease.          Passed - Valid encounter within last 12 months    Recent Outpatient Visits           1 week ago Hematuria, unspecified type   [facility], Erin E, PA-C   2 weeks ago Chest tightness   [place], [place], DO   7 months ago Routine general medical examination at a health care facility   [place], [place], DO   1 year ago Routine general medical examination at a health care facility   [facility], [place], DO   1 year ago Liver mass   [place], Barb Merino, DO       Future  Appointments             In 4 months Wynetta Emery, Barb Merino, DO [employer]

## 2022-05-04 ENCOUNTER — Ambulatory Visit: Payer: Self-pay | Admitting: *Deleted

## 2022-05-04 NOTE — Telephone Encounter (Signed)
  Chief Complaint: severe back pain possible kidney stone per patient  Symptoms: pain low abdominal area cramping left back "kidney" area radiates to left testicle. Pain worse at night. Comes and goes . Frequency: Friday night 05/01/22 Pertinent Negatives: Patient denies fever, no issues with urinating no pain down legs. No N/T reported Disposition: '[]'$ ED /'[]'$ Urgent Care (no appt availability in office) / '[x]'$ Appointment(In office/virtual)/ '[]'$  [place] [facility]/ '[]'$ Home Care/ '[]'$ Refused Recommended Disposition /'[]'$ [place] [facility]/ '[]'$  Follow-up with PCP Additional Notes:   My chart VV scheduled. Recommended if sx worsen today go to ED for evaluation. Please advise     Reason for Disposition  [1] SEVERE back pain (e.g., excruciating, unable to do any normal activities) AND [2] not improved 2 hours after pain medicine  Answer Assessment - Initial Assessment Questions 1. ONSET: "When did the pain begin?"      Friday night 05/01/22 2. LOCATION: "Where does it hurt?" (upper, mid or lower back)     Left back area at kidneys  3. SEVERITY: "How bad is the pain?"  (e.g., Scale 1-10; mild, moderate, or severe)   - MILD (1-3): Doesn't interfere with normal activities.    - MODERATE (4-7): Interferes with normal activities or awakens from sleep.    - SEVERE (8-10): Excruciating pain, unable to do any normal activities.      Severe at times. Unable to sleep laying down must sit up  4. PATTERN: "Is the pain constant?" (e.g., yes, no; constant, intermittent)      Comes and goes  5. RADIATION: "Does the pain shoot into your legs or somewhere else?"     From back to left testicle  6. CAUSE:  "What do you think is causing the back pain?"      Kidney stone  7. BACK OVERUSE:  "Any recent lifting of heavy objects, strenuous work or exercise?"     na 8. MEDICINES: "What have you taken so far for the pain?" (e.g., nothing, acetaminophen, NSAIDS)     na 9. NEUROLOGIC SYMPTOMS: "Do you have any  weakness, numbness, or problems with bowel/bladder control?"     na 10. OTHER SYMPTOMS: "Do you have any other symptoms?" (e.g., fever, abdomen pain, burning with urination, blood in urine)       Low abdominal cramping , left back "kidney" area, radiates to left testicle 11. PREGNANCY: "Is there any chance you are pregnant?" "When was your last menstrual period?"       na  Protocols used: Back Pain-A-AH

## 2022-05-05 ENCOUNTER — Telehealth (INDEPENDENT_AMBULATORY_CARE_PROVIDER_SITE_OTHER): Payer: BC Managed Care – PPO | Admitting: Family Medicine

## 2022-05-05 ENCOUNTER — Encounter: Payer: Self-pay | Admitting: Family Medicine

## 2022-05-05 DIAGNOSIS — N200 Calculus of kidney: Secondary | ICD-10-CM | POA: Diagnosis not present

## 2022-05-05 MED ORDER — TAMSULOSIN HCL 0.4 MG PO CAPS: 0.4000 mg | ORAL_CAPSULE | Freq: Every day | ORAL | 0 refills | Status: DC

## 2022-05-05 MED ORDER — OXYCODONE HCL 5 MG PO TABS: 5.0000 mg | ORAL_TABLET | Freq: Four times a day (QID) | ORAL | 0 refills | Status: DC | PRN

## 2022-05-05 NOTE — Assessment & Plan Note (Signed)
Will continue flomax and pain medicine for another couple of days to see if it passes- if it doesn't by Thursday AM, we will get him set up for CT to check on size. Call with any concerns. Continue to monitor.

## 2022-05-05 NOTE — Progress Notes (Signed)
There were no vitals taken for this visit.   Subjective:    Patient ID: Nathaniel Joy., male    DOB: [birth year], 52 y.o.   MRN: [id number]  HPI: Nathaniel Upshaw. is a 52 y.o. male  Chief Complaint  Patient presents with   Nephrolithiasis    Patient says his symptoms started Friday night. Patient says he is having lower left back pain that is radiating down into his left testicle and having abdominal cramping. Patient says he has some Flomax and Oxycodone that he had from a previous kidney stone.   URINARY SYMPTOMS Duration: 4-5 days Dysuria: yes Urinary frequency: no Urgency: no Small volume voids: no Symptom severity: severe Urinary incontinence: no Foul odor: no Hematuria: yes Abdominal pain: no Back pain: yes Suprapubic pain/pressure: no Flank pain: yes Fever:  no Vomiting: yes Relief with cranberry juice: no Relief with pyridium: no Status: better/worse/stable Previous urinary tract infection: no Previous Kidney Stone: no Sexual activity: monogomous History of sexually transmitted disease: no Penile discharge: no Treatments attempted: flomax    Relevant past medical, surgical, family and social history reviewed and updated as indicated. Interim medical history since our last visit reviewed. Allergies and medications reviewed and updated.  Review of Systems  Constitutional: Negative.   Respiratory: Negative.    Cardiovascular: Negative.   Gastrointestinal: Negative.   Genitourinary:  Positive for flank pain, hematuria and testicular pain. Negative for decreased urine volume, difficulty urinating, dysuria, enuresis, frequency, genital sores, penile discharge, penile pain, penile swelling, scrotal swelling and urgency.  Musculoskeletal:  Positive for back pain and myalgias. Negative for arthralgias, gait problem, joint swelling, neck pain and neck stiffness.  Skin: Negative.   Psychiatric/Behavioral: Negative.      Per HPI unless specifically  indicated above     Objective:    There were no vitals taken for this visit.  Wt Readings from Last 3 Encounters:  01/20/22 202 lb 14.4 oz (92 kg)  01/09/22 201 lb 9.6 oz (91.4 kg)  06/06/21 203 lb (92.1 kg)    Physical Exam Vitals and nursing note reviewed.  Constitutional:      General: He is not in acute distress.    Appearance: Normal appearance. He is not ill-appearing, toxic-appearing or diaphoretic.  HENT:     Head: Normocephalic and atraumatic.     Right Ear: External ear normal.     Left Ear: External ear normal.     Nose: Nose normal.     Mouth/Throat:     Mouth: Mucous membranes are moist.     Pharynx: Oropharynx is clear.  Eyes:     General: No scleral icterus.       Right eye: No discharge.        Left eye: No discharge.     Conjunctiva/sclera: Conjunctivae normal.     Pupils: Pupils are equal, round, and reactive to light.  Pulmonary:     Effort: Pulmonary effort is normal. No respiratory distress.     Comments: Speaking in full sentences Musculoskeletal:        General: Normal range of motion.     Cervical back: Normal range of motion.  Skin:    Coloration: Skin is not jaundiced or pale.     Findings: No bruising, erythema, lesion or rash.  Neurological:     Mental Status: He is alert and oriented to person, place, and time. Mental status is at baseline.  Psychiatric:        Mood and Affect:  Mood normal.        Behavior: Behavior normal.        Thought Content: Thought content normal.        Judgment: Judgment normal.     Results for orders placed or performed in visit on 01/20/22  Microscopic Examination   Urine  Result Value Ref Range   WBC, UA None seen 0 - 5 /hpf   RBC, Urine >30R 0 - 2 /hpf   Epithelial Cells (non renal) None seen 0 - 10 /hpf   Bacteria, UA Few None seen/Few  Urine Culture   Specimen: Urine   UR  Result Value Ref Range   Urine Culture, Routine Final report    Organism ID, Bacteria No growth   Urinalysis, Routine w  reflex microscopic  Result Value Ref Range   Specific Gravity, UA 1.020 1.005 - 1.030   pH, UA 7.5 5.0 - 7.5   Color, UA Amber (A) Yellow   Appearance Ur Turbid (A) Clear   Leukocytes,UA Negative Negative   Protein,UA 1+ (A) Negative/Trace   Glucose, UA Negative Negative   Ketones, UA Trace (A) Negative   RBC, UA 3+ (A) Negative   Bilirubin, UA Negative Negative   Urobilinogen, Ur 1.0 0.2 - 1.0 mg/dL   Nitrite, UA Negative Negative   Microscopic Examination See below:       Assessment & Plan:   Problem List Items Addressed This Visit       Genitourinary   Kidney stone - Primary    Will continue flomax and pain medicine for another couple of days to see if it passes- if it doesn't by Thursday AM, we will get him set up for CT to check on size. Call with any concerns. Continue to monitor.       Relevant Medications   oxyCODONE (OXY IR/ROXICODONE) 5 MG immediate release tablet   tamsulosin (FLOMAX) 0.4 MG CAPS capsule     Follow up plan: Return if symptoms worsen or fail to improve.    This visit was completed via video visit through MyChart due to the restrictions of the COVID-19 pandemic. All issues as above were discussed and addressed. Physical exam was done as above through visual confirmation on video through MyChart. If it was felt that the patient should be evaluated in the office, they were directed there. The patient verbally consented to this visit. Location of the patient: home Location of the provider: work Those involved with this call:  Provider: Park Liter, DO CMA: Irena Reichmann, Amelia Desk/Registration: [employer]  Time spent on call:  15 minutes with patient face to face via video conference. More than 50% of this time was spent in counseling and coordination of care. 23 minutes total spent in review of patient's record and preparation of their chart.

## 2022-05-07 ENCOUNTER — Encounter: Payer: Self-pay | Admitting: Family Medicine

## 2022-05-07 ENCOUNTER — Ambulatory Visit
Admission: RE | Admit: 2022-05-07 | Discharge: 2022-05-07 | Disposition: A | Discharge status: Home / Self Care | Payer: BC Managed Care – PPO | Source: Ambulatory Visit | Attending: Family Medicine | Admitting: Family Medicine

## 2022-05-07 ENCOUNTER — Other Ambulatory Visit: Payer: Self-pay | Admitting: Family Medicine

## 2022-05-07 DIAGNOSIS — R319 Hematuria, unspecified: Secondary | ICD-10-CM

## 2022-05-07 DIAGNOSIS — R1030 Lower abdominal pain, unspecified: Secondary | ICD-10-CM

## 2022-05-07 DIAGNOSIS — N200 Calculus of kidney: Secondary | ICD-10-CM | POA: Diagnosis present

## 2022-05-07 DIAGNOSIS — Q6211 Congenital occlusion of ureteropelvic junction: Secondary | ICD-10-CM

## 2022-05-08 ENCOUNTER — Telehealth: Payer: Self-pay | Admitting: Family Medicine

## 2022-05-08 MED ORDER — OXYCODONE HCL 5 MG PO TABS: 5.0000 mg | ORAL_TABLET | Freq: Four times a day (QID) | ORAL | 0 refills | Status: DC | PRN

## 2022-05-08 NOTE — Addendum Note (Signed)
Addended by: Valerie Roys on: 05/08/2022 01:03 PM   Modules accepted: Orders

## 2022-05-08 NOTE — Telephone Encounter (Signed)
[facility]pharmacy stated PA is needed on the medication oxyCODONE (OXY IR/ROXICODONE) 5 MG immediate release tablet  The pharmacist stated pt wants to pay for it out of pocket, and they just need the provider's approval and advice that she cannot get PA to go through.  Please advise.

## 2022-05-08 NOTE — Telephone Encounter (Signed)
Pt called saying the pharmacy told him that the insurance will not cover the extra pain medication that was prescribed.  Please advise.  [phone]

## 2022-05-08 NOTE — Telephone Encounter (Signed)
Patient called in to check on the status of his PA. Advise patient that someone will follow up when complete.

## 2022-05-11 ENCOUNTER — Encounter: Payer: Self-pay | Admitting: Urology

## 2022-05-11 ENCOUNTER — Ambulatory Visit: Payer: BC Managed Care – PPO | Admitting: Urology

## 2022-05-11 VITALS — BP 144/92 | HR 74 | Ht 69.0 in | Wt 200.0 lb

## 2022-05-11 DIAGNOSIS — N134 Hydroureter: Secondary | ICD-10-CM

## 2022-05-11 DIAGNOSIS — N201 Calculus of ureter: Secondary | ICD-10-CM

## 2022-05-11 DIAGNOSIS — N132 Hydronephrosis with renal and ureteral calculous obstruction: Secondary | ICD-10-CM

## 2022-05-11 DIAGNOSIS — N1330 Unspecified hydronephrosis: Secondary | ICD-10-CM | POA: Diagnosis not present

## 2022-05-11 DIAGNOSIS — N200 Calculus of kidney: Secondary | ICD-10-CM

## 2022-05-11 DIAGNOSIS — N23 Unspecified renal colic: Secondary | ICD-10-CM

## 2022-05-11 LAB — URINALYSIS, COMPLETE
Bilirubin, UA: NEGATIVE
Glucose, UA: NEGATIVE
Ketones, UA: NEGATIVE
Nitrite, UA: NEGATIVE
Specific Gravity, UA: 1.025 (ref 1.005–1.030)
Urobilinogen, Ur: 1 mg/dL (ref 0.2–1.0)
pH, UA: 5 (ref 5.0–7.5)

## 2022-05-11 LAB — MICROSCOPIC EXAMINATION

## 2022-05-11 MED ORDER — OXYCODONE HCL 5 MG PO CAPS: 5.0000 mg | ORAL_CAPSULE | ORAL | 0 refills | Status: DC | PRN

## 2022-05-11 NOTE — H&P (View-Only) (Signed)
05/11/2022 9:42 AM   Nathaniel Young. [birth year] [id number]  Referring provider: Valerie Roys, DO [place],  [place] [address]  Chief Complaint  Patient presents with   Nephrolithiasis    HPI: Nathaniel Young. is a 52 y.o. male referred for evaluation of nephrolithiasis.  PCP visit 01/20/2022 complaining of lower abdominal pain and hematuria x 4 days.  KUB with a possible calcification region of left UPJ and he was started on tamsulosin His pain resolved however he had recurrent left flank pain radiating to the LLQ/left testis 05/05/2022 Stone protocol CT 05/07/2022 with a 7 mm left proximal ureteral calculus with mild hydronephrosis.  Pain has been intermittent and controlled with immediate release oxycodone but is out of medication 1 episode of nausea/vomiting 05/05/2022 Denies fever, chills or lower urinary tract symptoms   PMH: Past Medical History:  Diagnosis Date   Diverticulosis    Hyperlipidemia    Hypertension    Nephrolithiasis    Urticaria     Surgical History: Past Surgical History:  Procedure Laterality Date   COLONOSCOPY WITH PROPOFOL N/A 01/27/2017   Procedure: COLONOSCOPY WITH PROPOFOL;  Surgeon: Jonathon Bellows, MD;  Location: [facility] ENDOSCOPY;  Service: Gastroenterology;  Laterality: N/A;    Home Medications:  Allergies as of 05/11/2022       Reactions   Naproxen Hives   Robaxin [methocarbamol] Rash        Medication List        Accurate as of May 11, 2022  9:42 AM. If you have any questions, ask your nurse or doctor.          STOP taking these medications    oxyCODONE 5 MG immediate release tablet Commonly known as: Oxy IR/ROXICODONE Stopped by: Abbie Sons, MD       TAKE these medications    cyclobenzaprine 10 MG tablet Commonly known as: FLEXERIL Take 1 tablet (10 mg total) by mouth at bedtime.   SUMAtriptan 100 MG tablet Commonly known as: Imitrex Take 1 tablet (100 mg total) by mouth every 2 (two) hours  as needed for migraine. May repeat in 2 hours if headache persists or recurs.   tamsulosin 0.4 MG Caps capsule Commonly known as: FLOMAX Take 1 capsule (0.4 mg total) by mouth daily.        Allergies:  Allergies  Allergen Reactions   Naproxen Hives   Robaxin [Methocarbamol] Rash    Family History: Family History  Problem Relation Age of Onset   Hypertension Mother    Cancer Father        throat   COPD Father    AAA (abdominal aortic aneurysm) Father    Aneurysm Father        brain    Social History:  reports that he has never smoked. He has never used smokeless tobacco. He reports that he does not drink alcohol and does not use drugs.   Physical Exam: BP (!) 144/92   Pulse 74   Ht '5\' 9"'$  (1.753 m)   Wt 200 lb (90.7 kg)   BMI 29.53 kg/m   Constitutional:  Alert and oriented, No acute distress. HEENT: [place] AT Respiratory: Normal respiratory effort, no increased work of breathing. Psychiatric: Normal mood and affect.  Laboratory Data:  Urinalysis Dipstick trace blood/trace leukocytes Microscopy 11-30 WBC   Pertinent Imaging: KUB/CT images were personally reviewed and interpreted.  This appears to be the same stone as the episode in October 2023  DG Abd  1 View  Narrative CLINICAL DATA:  Flank pain hematuria  EXAM: ABDOMEN - 1 VIEW  COMPARISON:  None Available.  FINDINGS: Nonobstructed gas pattern. Faint 4 mm calcification in the left paraspinal region at approximate inferior L1 level.  IMPRESSION: 1. Possible faint 4 mm calcification in the region of left UPJ. Follow-up CT KUB may be obtained as clinically indicated   Electronically Signed By: Donavan Foil M.D. On: 01/20/2022 15:51   CT RENAL STONE STUDY  Narrative CLINICAL DATA:  Left flank pain.  EXAM: CT ABDOMEN AND PELVIS WITHOUT CONTRAST  TECHNIQUE: Multidetector CT imaging of the abdomen and pelvis was performed following the standard protocol without IV contrast.  RADIATION  DOSE REDUCTION: This exam was performed according to the departmental dose-optimization program which includes automated exposure control, adjustment of the mA and/or kV according to patient size and/or use of iterative reconstruction technique.  COMPARISON:  Abdomen radiograph 01/20/2022 and MRI from 04/23/2020  FINDINGS: Lower chest: 4.5 cm hypodense mass in segment 6 of the liver on image 29 series 2, shown on the MRI abdomen of 04/23/2020 to be a benign hepatic hemangioma. This warrants no further imaging workup.  The liver and gallbladder appear otherwise normal.  Hepatobiliary: Unremarkable  Pancreas: Unremarkable  Spleen: Unremarkable  Adrenals/Urinary Tract: Mild left hydronephrosis and proximal hydroureter extending down to a 0.7 by 0.4 by 0.6 cm proximal ureteral calculus just below the UPJ, at the level of the lower margin of the left kidney. This is shown on image 88 series 4 and image 50 series 2.  A 1 mm punctate calcification in the parenchyma of the right mid kidney is associated with a small Bosniak category 2 cyst which demonstrated no enhancement on the MRI of 04/23/2020. This lesion does not warrant any further workup.  No other urinary tract calculi are present. Urinary bladder unremarkable.  Stomach/Bowel: Small periampullary duodenal diverticulum without signs of inflammation normal appendix. Sigmoid colon diverticulosis, no active diverticulitis.  Vascular/Lymphatic: Mild aortoiliac atherosclerotic vascular calcification.  Reproductive: Unremarkable  Other: No supplemental non-categorized findings.  Musculoskeletal: 0.5 cm metal BB or pellet in the medial portion of the right adductor magnus muscle just below the right inferior pubic ramus on image 104 series 2.  0.9 cm subcutaneous cystic lesion along the left back just to the left of midline at the vertical level of T12, probably a sebaceous cyst or similar benign subcutaneous  lesion.  IMPRESSION: 1. Mild left hydronephrosis and proximal hydroureter extending down to a 0.7 by 0.4 by 0.6 cm proximal ureteral calculus just below the UPJ, at the level of the lower margin of the left kidney. No other urinary tract calculi are present. 2. Benign hepatic hemangioma.  No further workup indicated. 3. Small periampullary duodenal diverticulum. 4. Sigmoid colon diverticulosis. 5. Mild aortoiliac atherosclerotic vascular calcification. 6. 0.5 cm metal BB or pellet in the medial portion of the right adductor magnus muscle just below the right inferior pubic ramus.  Aortic Atherosclerosis (ICD10-I70.0).   Electronically Signed By: Van Clines M.D. On: 05/07/2022 15:09   Assessment & Plan:    1.  Left ureteral calculus We discussed various treatment options for urolithiasis including observation with or without medical expulsive therapy, shockwave lithotripsy (SWL), ureteroscopy and laser lithotripsy with stent placement. We discussed that management is based on stone size, location, density, patient co-morbidities, and patient preference.  Stones <28m in size have a >80% spontaneous passage rate. Data surrounding the use of tamsulosin for medical expulsive therapy is controversial, but  meta analyses suggests it is most efficacious for distal stones between 5-60m in size.  Since this appears to be the stone that has been present since October 2023 would not recommend MET SWL has a lower stone free rate in a single procedure, but also a lower complication rate compared to ureteroscopy and avoids a stent and associated stent related symptoms. Possible complications include renal hematoma, steinstrasse, and need for additional treatment. Ureteroscopy with laser lithotripsy and stent placement has a higher stone free rate than SWL in a single procedure, however increased complication rate including possible infection, ureteral injury, bleeding, and stent related  morbidity. Common stent related symptoms include dysuria, urgency/frequency, and flank pain. After an extensive discussion of the risks and benefits of the above treatment options, the patient would like to proceed with SWL.  On 05/14/2022 Urinalysis today with pyuria however he has no clinical signs of infection.  A urine culture was ordered and will start empirically on Septra DS Oxycodone was refilled   SAbbie Sons MD  [id number] [place] [phone]

## 2022-05-11 NOTE — Progress Notes (Unsigned)
05/11/2022 9:42 AM   Nathaniel Young. September 29, 1970 [id number]  Referring provider: Valerie Roys, DO [place],  [place] [address]  Chief Complaint  Patient presents with   Nephrolithiasis    HPI: Nathaniel Young. is a 52 y.o. male referred for evaluation of nephrolithiasis.  PCP visit 01/20/2022 complaining of lower abdominal pain and hematuria x 4 days.  KUB with a possible calcification region of left UPJ and he was started on tamsulosin Stone protocol CT 05/07/2022 with a 7 mm left proximal ureteral calculus with mild hydronephrosis   PMH: Past Medical History:  Diagnosis Date   Diverticulosis    Hyperlipidemia    Hypertension    Nephrolithiasis    Urticaria     Surgical History: Past Surgical History:  Procedure Laterality Date   COLONOSCOPY WITH PROPOFOL N/A 01/27/2017   Procedure: COLONOSCOPY WITH PROPOFOL;  Surgeon: Nathaniel Bellows, MD;  Location: [facility] ENDOSCOPY;  Service: Gastroenterology;  Laterality: N/A;    Home Medications:  Allergies as of 05/11/2022       Reactions   Naproxen Hives   Robaxin [methocarbamol] Rash        Medication List        Accurate as of May 11, 2022  9:42 AM. If you have any questions, ask your nurse or doctor.          STOP taking these medications    oxyCODONE 5 MG immediate release tablet Commonly known as: Oxy IR/ROXICODONE Stopped by: Nathaniel Sons, MD       TAKE these medications    cyclobenzaprine 10 MG tablet Commonly known as: FLEXERIL Take 1 tablet (10 mg total) by mouth at bedtime.   SUMAtriptan 100 MG tablet Commonly known as: Imitrex Take 1 tablet (100 mg total) by mouth every 2 (two) hours as needed for migraine. May repeat in 2 hours if headache persists or recurs.   tamsulosin 0.4 MG Caps capsule Commonly known as: FLOMAX Take 1 capsule (0.4 mg total) by mouth daily.        Allergies:  Allergies  Allergen Reactions   Naproxen Hives   Robaxin [Methocarbamol] Rash     Family History: Family History  Problem Relation Age of Onset   Hypertension Mother    Cancer Father        throat   COPD Father    AAA (abdominal aortic aneurysm) Father    Aneurysm Father        brain    Social History:  reports that he has never smoked. He has never used smokeless tobacco. He reports that he does not drink alcohol and does not use drugs.   Physical Exam: BP (!) 144/92   Pulse 74   Ht '5\' 9"'$  (1.753 m)   Wt 200 lb (90.7 kg)   BMI 29.53 kg/m   Constitutional:  Alert and oriented, No acute distress. HEENT: [place] AT, moist mucus membranes.  Trachea midline, no masses. Cardiovascular: No clubbing, cyanosis, or edema. Respiratory: Normal respiratory effort, no increased work of breathing. GI: Abdomen is soft, nontender, nondistended, no abdominal masses GU: No CVA tenderness Skin: No rashes, bruises or suspicious lesions. Neurologic: Grossly intact, no focal deficits, moving all 4 extremities. Psychiatric: Normal mood and affect.  Laboratory Data: Lab Results  Component Value Date   WBC 6.2 01/09/2022   HGB 15.9 01/09/2022   HCT 47.3 01/09/2022   MCV 87 01/09/2022   PLT 216 01/09/2022    Lab Results  Component  Value Date   CREATININE 1.26 01/09/2022    No results found for: "PSA"  No results found for: "TESTOSTERONE"  No results found for: "HGBA1C"  Urinalysis    Component Value Date/Time   APPEARANCEUR Turbid (A) 01/20/2022 1351   GLUCOSEU Negative 01/20/2022 1351   BILIRUBINUR Negative 01/20/2022 1351   PROTEINUR 1+ (A) 01/20/2022 1351   NITRITE Negative 01/20/2022 1351   LEUKOCYTESUR Negative 01/20/2022 1351    Lab Results  Component Value Date   LABMICR See below: 01/20/2022   WBCUA None seen 01/20/2022   RBCUA 0-2 06/15/2017   LABEPIT None seen 01/20/2022   BACTERIA Few 01/20/2022    Pertinent Imaging: *** Results for orders placed during the hospital encounter of 01/20/22  DG Abd 1 View  Narrative CLINICAL DATA:   Flank pain hematuria  EXAM: ABDOMEN - 1 VIEW  COMPARISON:  None Available.  FINDINGS: Nonobstructed gas pattern. Faint 4 mm calcification in the left paraspinal region at approximate inferior L1 level.  IMPRESSION: 1. Possible faint 4 mm calcification in the region of left UPJ. Follow-up CT KUB may be obtained as clinically indicated   Electronically Signed By: Donavan Foil M.D. On: 01/20/2022 15:51  No results found for this or any previous visit.  No results found for this or any previous visit.  No results found for this or any previous visit.  No results found for this or any previous visit.  No valid procedures specified. No results found for this or any previous visit.  Results for orders placed during the hospital encounter of 05/07/22  CT RENAL STONE STUDY  Narrative CLINICAL DATA:  Left flank pain.  EXAM: CT ABDOMEN AND PELVIS WITHOUT CONTRAST  TECHNIQUE: Multidetector CT imaging of the abdomen and pelvis was performed following the standard protocol without IV contrast.  RADIATION DOSE REDUCTION: This exam was performed according to the departmental dose-optimization program which includes automated exposure control, adjustment of the mA and/or kV according to patient size and/or use of iterative reconstruction technique.  COMPARISON:  Abdomen radiograph 01/20/2022 and MRI from 04/23/2020  FINDINGS: Lower chest: 4.5 cm hypodense mass in segment 6 of the liver on image 29 series 2, shown on the MRI abdomen of 04/23/2020 to be a benign hepatic hemangioma. This warrants no further imaging workup.  The liver and gallbladder appear otherwise normal.  Hepatobiliary: Unremarkable  Pancreas: Unremarkable  Spleen: Unremarkable  Adrenals/Urinary Tract: Mild left hydronephrosis and proximal hydroureter extending down to a 0.7 by 0.4 by 0.6 cm proximal ureteral calculus just below the UPJ, at the level of the lower margin of the left kidney. This is  shown on image 88 series 4 and image 50 series 2.  A 1 mm punctate calcification in the parenchyma of the right mid kidney is associated with a small Bosniak category 2 cyst which demonstrated no enhancement on the MRI of 04/23/2020. This lesion does not warrant any further workup.  No other urinary tract calculi are present. Urinary bladder unremarkable.  Stomach/Bowel: Small periampullary duodenal diverticulum without signs of inflammation normal appendix. Sigmoid colon diverticulosis, no active diverticulitis.  Vascular/Lymphatic: Mild aortoiliac atherosclerotic vascular calcification.  Reproductive: Unremarkable  Other: No supplemental non-categorized findings.  Musculoskeletal: 0.5 cm metal BB or pellet in the medial portion of the right adductor magnus muscle just below the right inferior pubic ramus on image 104 series 2.  0.9 cm subcutaneous cystic lesion along the left back just to the left of midline at the vertical level of T12, probably a  sebaceous cyst or similar benign subcutaneous lesion.  IMPRESSION: 1. Mild left hydronephrosis and proximal hydroureter extending down to a 0.7 by 0.4 by 0.6 cm proximal ureteral calculus just below the UPJ, at the level of the lower margin of the left kidney. No other urinary tract calculi are present. 2. Benign hepatic hemangioma.  No further workup indicated. 3. Small periampullary duodenal diverticulum. 4. Sigmoid colon diverticulosis. 5. Mild aortoiliac atherosclerotic vascular calcification. 6. 0.5 cm metal BB or pellet in the medial portion of the right adductor magnus muscle just below the right inferior pubic ramus.  Aortic Atherosclerosis (ICD10-I70.0).   Electronically Signed By: Van Clines M.D. On: 05/07/2022 15:09   Assessment & Plan:    1. Kidney stone *** - Urinalysis, Complete   No follow-ups on file.  Nathaniel Young, [place] [address] [place], [place] [address] [phone]

## 2022-05-11 NOTE — Telephone Encounter (Signed)
Reached out to patient via MyChart.  

## 2022-05-12 ENCOUNTER — Other Ambulatory Visit: Payer: Self-pay

## 2022-05-12 ENCOUNTER — Other Ambulatory Visit
Admission: RE | Admit: 2022-05-12 | Discharge: 2022-05-12 | Disposition: A | Discharge status: Home / Self Care | Payer: BC Managed Care – PPO | Source: Home / Self Care | Attending: Urology | Admitting: Urology

## 2022-05-12 ENCOUNTER — Telehealth: Payer: Self-pay | Admitting: *Deleted

## 2022-05-12 ENCOUNTER — Encounter: Payer: Self-pay | Admitting: Urology

## 2022-05-12 ENCOUNTER — Other Ambulatory Visit: Payer: Self-pay | Admitting: *Deleted

## 2022-05-12 DIAGNOSIS — N201 Calculus of ureter: Secondary | ICD-10-CM

## 2022-05-12 DIAGNOSIS — N132 Hydronephrosis with renal and ureteral calculous obstruction: Secondary | ICD-10-CM | POA: Diagnosis not present

## 2022-05-12 DIAGNOSIS — I10 Essential (primary) hypertension: Secondary | ICD-10-CM | POA: Diagnosis not present

## 2022-05-12 DIAGNOSIS — N200 Calculus of kidney: Secondary | ICD-10-CM

## 2022-05-12 DIAGNOSIS — E785 Hyperlipidemia, unspecified: Secondary | ICD-10-CM | POA: Diagnosis not present

## 2022-05-12 MED ORDER — SULFAMETHOXAZOLE-TRIMETHOPRIM 800-160 MG PO TABS: 1.0000 | ORAL_TABLET | Freq: Two times a day (BID) | ORAL | 0 refills | Status: AC

## 2022-05-12 NOTE — Telephone Encounter (Signed)
-----   Message from Abbie Sons, MD sent at 05/11/2022  5:11 PM EST ----- Urine culture did show white blood cells.  Please order a urine culture at the hospital lab and have him do Tuesday 2/6.  Go ahead and send in Rx Septra DS 1 twice daily x 7 days

## 2022-05-12 NOTE — Telephone Encounter (Signed)
Notified patient as instructed, patient will get lab done today at hospital

## 2022-05-12 NOTE — Progress Notes (Signed)
ESWL ORDER FORM  Expected date of procedure: 05/14/2022  Surgeon: Kreston Giovanni, MD  Post op standing: 2-4wk follow up w/KUB prior  Anticoagulation/Aspirin/NSAID standing order: Hold all 72 hours prior  Anesthesia standing order: MAC  VTE standing: SCD's  Dx: Left Ureteral Stone  Procedure: left Extracorporeal shock wave lithotripsy  CPT : 85992  Standing Order Set:   *NPO after mn, KUB  *NS 136m/hr, Keflex '500mg'$  PO, Benadryl '25mg'$  PO, Valium '10mg'$  PO, Zofran '4mg'$  IV    Medications if other than standing orders:   NONE

## 2022-05-13 LAB — URINE CULTURE: Culture: NO GROWTH

## 2022-05-14 ENCOUNTER — Ambulatory Visit: Payer: BC Managed Care – PPO

## 2022-05-14 ENCOUNTER — Other Ambulatory Visit: Payer: Self-pay

## 2022-05-14 ENCOUNTER — Encounter: Payer: Self-pay | Admitting: Urology

## 2022-05-14 ENCOUNTER — Ambulatory Visit
Admission: RE | Admit: 2022-05-14 | Discharge: 2022-05-14 | Disposition: A | Discharge status: Home / Self Care | Payer: BC Managed Care – PPO | Source: Ambulatory Visit | Attending: Urology | Admitting: Urology

## 2022-05-14 ENCOUNTER — Encounter
Admission: RE | Disposition: A | Discharge status: Home / Self Care | Payer: Self-pay | Source: Ambulatory Visit | Attending: Urology

## 2022-05-14 DIAGNOSIS — E785 Hyperlipidemia, unspecified: Secondary | ICD-10-CM | POA: Insufficient documentation

## 2022-05-14 DIAGNOSIS — N132 Hydronephrosis with renal and ureteral calculous obstruction: Secondary | ICD-10-CM | POA: Insufficient documentation

## 2022-05-14 DIAGNOSIS — N201 Calculus of ureter: Secondary | ICD-10-CM

## 2022-05-14 DIAGNOSIS — I10 Essential (primary) hypertension: Secondary | ICD-10-CM | POA: Insufficient documentation

## 2022-05-14 HISTORY — PX: EXTRACORPOREAL SHOCK WAVE LITHOTRIPSY: SHX1557

## 2022-05-14 SURGERY — LITHOTRIPSY, ESWL
Anesthesia: Moderate Sedation | Laterality: Left

## 2022-05-14 MED ORDER — DIPHENHYDRAMINE HCL 25 MG PO CAPS
ORAL_CAPSULE | ORAL | Status: AC
  Filled 2022-05-14: qty 1

## 2022-05-14 MED ORDER — SODIUM CHLORIDE 0.9 % IV SOLN: INTRAVENOUS | Status: DC

## 2022-05-14 MED ORDER — CEPHALEXIN 500 MG PO CAPS
ORAL_CAPSULE | ORAL | Status: AC
  Filled 2022-05-14: qty 1

## 2022-05-14 MED ORDER — ONDANSETRON HCL 4 MG/2ML IJ SOLN
INTRAMUSCULAR | Status: AC
  Filled 2022-05-14: qty 2

## 2022-05-14 MED ORDER — ONDANSETRON HCL 4 MG/2ML IJ SOLN
4.0000 mg | Freq: Once | INTRAMUSCULAR | Status: AC
  Administered 2022-05-14: 4 mg via INTRAVENOUS

## 2022-05-14 MED ORDER — DIPHENHYDRAMINE HCL 25 MG PO CAPS
25.0000 mg | ORAL_CAPSULE | ORAL | Status: AC
  Administered 2022-05-14: 25 mg via ORAL

## 2022-05-14 MED ORDER — DIAZEPAM 5 MG PO TABS
ORAL_TABLET | ORAL | Status: AC
  Administered 2022-05-14: 5 mg
  Filled 2022-05-14: qty 2

## 2022-05-14 MED ORDER — CEPHALEXIN 500 MG PO CAPS
500.0000 mg | ORAL_CAPSULE | Freq: Once | ORAL | Status: AC
  Administered 2022-05-14: 500 mg via ORAL

## 2022-05-14 MED ORDER — DIAZEPAM 5 MG PO TABS
10.0000 mg | ORAL_TABLET | ORAL | Status: AC
  Administered 2022-05-14: 10 mg via ORAL

## 2022-05-14 NOTE — Discharge Instructions (Addendum)
AMBULATORY SURGERY  DISCHARGE INSTRUCTIONS   The drugs that you were given will stay in your system until tomorrow so for the next 24 hours you should not:  Drive an automobile Make any legal decisions Drink any alcoholic beverage   You may resume regular meals tomorrow.  Today it is better to start with liquids and gradually work up to solid foods.  You may eat anything you prefer, but it is better to start with liquids, then soup and crackers, and gradually work up to solid foods.   Please notify your doctor immediately if you have any unusual bleeding, trouble breathing, redness and pain at the surgery site, drainage, fever, or pain not relieved by medication.    Additional Instructions:   As per the [facility] discharge instructions Continue pain medication as needed Continue tamsulosin which will help you pass stone fragments  Call [facility] at [phone] for pain not controlled with oral medications or fever greater than 101 degrees You will be scheduled for a follow-up appointment with x-ray in 2-3 weeks.  The office will contact you      Please contact your physician with any problems or Same Day Surgery at [phone], Monday through Friday 6 am to 4 pm, or [place] at [facility] number at [phone].

## 2022-05-14 NOTE — Interval H&P Note (Signed)
History and Physical Interval Note:  CV:RRR Lungs:clear  05/14/2022 9:02 AM  Nathaniel Young.  has presented today for surgery, with the diagnosis of Left Ureteral Stone.  The various methods of treatment have been discussed with the patient and family. After consideration of risks, benefits and other options for treatment, the patient has consented to  Procedure(s): EXTRACORPOREAL SHOCK WAVE LITHOTRIPSY (ESWL) (Left) as a surgical intervention.  The patient's history has been reviewed, patient examined, no change in status, stable for surgery.  I have reviewed the patient's chart and labs.  Questions were answered to the patient's satisfaction.     [place]

## 2022-05-15 ENCOUNTER — Encounter: Payer: Self-pay | Admitting: Urology

## 2022-05-27 ENCOUNTER — Other Ambulatory Visit: Payer: Self-pay | Admitting: Family Medicine

## 2022-05-27 DIAGNOSIS — N200 Calculus of kidney: Secondary | ICD-10-CM

## 2022-05-27 NOTE — Telephone Encounter (Signed)
Requested medication (s) are due for refill today: yes  Requested medication (s) are on the active medication list: yes  Last refill:  05/05/22 #30   Future visit scheduled: yes and to urologist  Notes to clinic:  was started before lithotripsy- on the AVS for procedure pt was to ask if the Flomax is to be restarted   Requested Prescriptions  Pending Prescriptions Disp Refills   tamsulosin (FLOMAX) 0.4 MG CAPS capsule [Pharmacy Med Name: TAMSULOSIN HCL 0.4 MG CAPSULE] 90 capsule 1    Sig: TAKE 1 [place]     Urology: Alpha-Adrenergic Blocker Passed - 05/27/2022  8:32 AM      Passed - PSA in normal range and within 360 days    Prostate Specific Ag, Serum  Date Value Ref Range Status  06/06/2021 1.1 0.0 - 4.0 ng/mL Final    Comment:    Roche ECLIA methodology. According to the American Urological Association, Serum PSA should decrease and remain at undetectable levels after radical prostatectomy. The AUA defines biochemical recurrence as an initial PSA value 0.2 ng/mL or greater followed by a subsequent confirmatory PSA value 0.2 ng/mL or greater. Values obtained with different assay methods or kits cannot be used interchangeably. Results cannot be interpreted as absolute evidence of the presence or absence of malignant disease.          Passed - Last BP in normal range    BP Readings from Last 1 Encounters:  05/14/22 124/87         Passed - Valid encounter within last 12 months    Recent Outpatient Visits           3 weeks ago Kidney stone   [place], DO   4 months ago Hematuria, unspecified type   [facility] M[place], Dani Gobble, PA-C   4 months ago Chest tightness   [place], [place], DO   11 months ago Routine general medical examination at a health care facility   [place], [place], DO   1 year ago Routine  general medical examination at a health care facility   [facility] Valerie Roys, DO       Future Appointments             In 1 week [place], [place] Payment [place]   In 1 week Valerie Roys, DO [place], [facility]

## 2022-06-03 NOTE — Progress Notes (Signed)
06/04/2022 3:40 PM   Nathaniel Maw Jr. 1970-07-29 [id number]  Referring provider: Valerie Roys, DO [place],  [place] [address]  Urological history 1.  Nephrolithiasis -Spontaneous passage of stone in 2021  Chief Complaint  Patient presents with   Follow-up   Nephrolithiasis    HPI: Nathaniel Wimsatt. is a 52 y.o. who is status post ESWL who presents today for follow up.  Underwent ESWL on 05/14/2022 for 7 mm left proximal calculus with Dr. Bernardo Heater.  Their postprocedural course was as expected and uneventful.   They have passed fragments.    They bring in fragments for analysis.   He is still having some left lower quadrant pain, but it is not excruciating as it had been in the past.  KUB 7 mm left proximal calculus is no longer visualized, some debris is located in the left lower pelvis  PMH: Past Medical History:  Diagnosis Date   Diverticulosis    Hyperlipidemia    Hypertension    Nephrolithiasis    Urticaria     Surgical History: Past Surgical History:  Procedure Laterality Date   COLONOSCOPY WITH PROPOFOL N/A 01/27/2017   Procedure: COLONOSCOPY WITH PROPOFOL;  Surgeon: Jonathon Bellows, MD;  Location[facility] ENDOSCOPY;  Service: Gastroenterology;  Laterality: N/A;   EXTRACORPOREAL SHOCK WAVE LITHOTRIPSY Left 05/14/2022   Procedure: EXTRACORPOREAL SHOCK WAVE LITHOTRIPSY (ESWL);  Surgeon: Abbie Sons, MD;  Location: [facility] ORS;  Service: Urology;  Laterality: Left;    Home Medications:  Current Outpatient Medications on File Prior to Visit  Medication Sig Dispense Refill   cyclobenzaprine (FLEXERIL) 10 MG tablet Take 1 tablet (10 mg total) by mouth at bedtime. 30 tablet 6   oxycodone (OXY-IR) 5 MG capsule Take 1 capsule (5 mg total) by mouth every 4 (four) hours as needed. 15 capsule 0   SUMAtriptan (IMITREX) 100 MG tablet Take 1 tablet (100 mg total) by mouth every 2 (two) hours as needed for migraine. May repeat in 2 hours if headache persists or recurs.  10 tablet 12   No current facility-administered medications on file prior to visit.    Allergies:  Allergies  Allergen Reactions   Naproxen Hives   Robaxin [Methocarbamol] Rash    Family History: Family History  Problem Relation Age of Onset   Hypertension Mother    Cancer Father        throat   COPD Father    AAA (abdominal aortic aneurysm) Father    Aneurysm Father        brain    Social History:  reports that he has never smoked. He has never been exposed to tobacco smoke. He has never used smokeless tobacco. He reports that he does not drink alcohol and does not use drugs.  ROS: Pertinent ROS in HPI  Physical Exam: BP (!) 154/95   Pulse 85   Ht 6' (1.829 m)   Wt 200 lb (90.7 kg)   BMI 27.12 kg/m   Constitutional:  Well nourished. Alert and oriented, No acute distress. HEENT: [place] AT, mask in place.   Trachea midline. Cardiovascular: No clubbing, cyanosis, or edema. Respiratory: Normal respiratory effort, no increased work of breathing. Neurologic: Grossly intact, no focal deficits, moving all 4 extremities. Psychiatric: Normal mood and affect.  Laboratory Data: Lab Results  Component Value Date   WBC 6.2 01/09/2022   HGB 15.9 01/09/2022   HCT 47.3 01/09/2022   MCV 87 01/09/2022   PLT 216 01/09/2022  Lab Results  Component Value Date   CREATININE 1.26 01/09/2022       Component Value Date/Time   CHOL 256 (H) 06/06/2021 0909   HDL 44 06/06/2021 0909   LDLCALC 176 (H) 06/06/2021 0909    Urinalysis    Component Value Date/Time   APPEARANCEUR Hazy (A) 05/11/2022 0915   GLUCOSEU Negative 05/11/2022 0915   BILIRUBINUR Negative 05/11/2022 0915   PROTEINUR 1+ (A) 05/11/2022 0915   NITRITE Negative 05/11/2022 0915   LEUKOCYTESUR Trace (A) 05/11/2022 0915  I have reviewed the labs.   Pertinent Imaging: CLINICAL DATA:  Left Ureteral Stone   EXAM: ABDOMEN - 1 VIEW   COMPARISON:  May 14, 2022   FINDINGS: Calcific density at the level of  LEFT L3-4 is no longer visualized. No new radiopaque densities are identified. Metallic BB within the RIGHT inguinal tissues. LEFT-sided assimilation joint at L5-S1.   IMPRESSION: Calcific density at the level of LEFT L3-4 is no longer visualized consistent with treated LEFT ureteral stone.     Electronically Signed   By: Valentino Saxon M.D.   On: 06/06/2022 20:35   I have independently reviewed the films.    Assessment & Plan:    1. Left ureteral stone -Possible small fragments left in the lower ureter -Refill tamsulosin -Stone fragments sent for analysis -Encouraged to continue to push fluids   Return in about 1 month (around 07/03/2022) for KUB .  These notes generated with voice recognition software. I apologize for typographical errors.  [place], [place] [address]  [place], [place] [address] [phone]

## 2022-06-04 ENCOUNTER — Ambulatory Visit: Payer: BC Managed Care – PPO | Admitting: Urology

## 2022-06-04 ENCOUNTER — Ambulatory Visit
Admission: RE | Admit: 2022-06-04 | Discharge: 2022-06-04 | Disposition: A | Discharge status: Home / Self Care | Payer: BC Managed Care – PPO | Source: Ambulatory Visit | Attending: Urology | Admitting: Urology

## 2022-06-04 ENCOUNTER — Encounter: Payer: Self-pay | Admitting: Urology

## 2022-06-04 VITALS — BP 154/95 | HR 85 | Ht 72.0 in | Wt 200.0 lb

## 2022-06-04 DIAGNOSIS — N201 Calculus of ureter: Secondary | ICD-10-CM

## 2022-06-04 DIAGNOSIS — N200 Calculus of kidney: Secondary | ICD-10-CM

## 2022-06-04 MED ORDER — TAMSULOSIN HCL 0.4 MG PO CAPS: 0.4000 mg | ORAL_CAPSULE | Freq: Every day | ORAL | 0 refills | Status: DC

## 2022-06-08 ENCOUNTER — Ambulatory Visit (INDEPENDENT_AMBULATORY_CARE_PROVIDER_SITE_OTHER): Payer: BC Managed Care – PPO | Admitting: Family Medicine

## 2022-06-08 ENCOUNTER — Encounter: Payer: Self-pay | Admitting: Family Medicine

## 2022-06-08 VITALS — BP 127/83 | HR 73 | Temp 98.4°F | Wt 202.0 lb

## 2022-06-08 DIAGNOSIS — I10 Essential (primary) hypertension: Secondary | ICD-10-CM | POA: Diagnosis not present

## 2022-06-08 DIAGNOSIS — Z1211 Encounter for screening for malignant neoplasm of colon: Secondary | ICD-10-CM | POA: Diagnosis not present

## 2022-06-08 DIAGNOSIS — E782 Mixed hyperlipidemia: Secondary | ICD-10-CM | POA: Diagnosis not present

## 2022-06-08 DIAGNOSIS — Z0000 Encounter for general adult medical examination without abnormal findings: Secondary | ICD-10-CM

## 2022-06-08 LAB — URINALYSIS, ROUTINE W REFLEX MICROSCOPIC
Bilirubin, UA: NEGATIVE
Glucose, UA: NEGATIVE
Ketones, UA: NEGATIVE
Leukocytes,UA: NEGATIVE
Nitrite, UA: NEGATIVE
Protein,UA: NEGATIVE
RBC, UA: NEGATIVE
Specific Gravity, UA: 1.025 (ref 1.005–1.030)
Urobilinogen, Ur: 0.2 mg/dL (ref 0.2–1.0)
pH, UA: 5 (ref 5.0–7.5)

## 2022-06-08 LAB — MICROALBUMIN, URINE WAIVED
Creatinine, Urine Waived: 200 mg/dL (ref 10–300)
Microalb, Ur Waived: 30 mg/L — ABNORMAL HIGH (ref 0–19)
Microalb/Creat Ratio: <30 mg/g (ref <30)

## 2022-06-08 MED ORDER — CYCLOBENZAPRINE HCL 10 MG PO TABS: 10.0000 mg | ORAL_TABLET | Freq: Every day | ORAL | 6 refills | Status: DC

## 2022-06-08 MED ORDER — SUMATRIPTAN SUCCINATE 100 MG PO TABS: 100.0000 mg | ORAL_TABLET | ORAL | 12 refills | Status: DC | PRN

## 2022-06-08 NOTE — Assessment & Plan Note (Signed)
Doing well off medicine. Continue to monitor. Call with any concerns.  

## 2022-06-08 NOTE — Assessment & Plan Note (Signed)
Rechecking labs today. Await results. Treat as needed.  °

## 2022-06-08 NOTE — Progress Notes (Signed)
BP 127/83   Pulse 73   Temp 98.4 F (36.9 C) (Oral)   Wt 202 lb (91.6 kg)   SpO2 97%   BMI 27.40 kg/m    Subjective:    Patient ID: Nathaniel Joy., male    DOB: Jan 04, 1971, 52 y.o.   MRN: [id number]  HPI: Nathaniel Bonniwell. is a 52 y.o. male presenting on 06/08/2022 for comprehensive medical examination. Current medical complaints include:  HYPERTENSION / HYPERLIPIDEMIA Satisfied with current treatment? yes Duration of hypertension:  off and on BP monitoring frequency: not checking BP medication side effects: N/A Past BP meds: none Duration of hyperlipidemia: chronic Cholesterol medication side effects: N/A Cholesterol supplements: none Past cholesterol medications: none Medication compliance: N/A Aspirin: no Recent stressors: no Recurrent headaches: no Visual changes: no Palpitations: no Dyspnea: no Chest pain: no Lower extremity edema: no Dizzy/lightheaded: no  Interim Problems from his last visit: no  Depression Screen done today and results listed below:     06/08/2022    8:55 AM 01/20/2022    2:09 PM 01/09/2022   11:19 AM 06/06/2021    8:42 AM 06/04/2020    9:06 AM  Depression screen PHQ 2/9  Decreased Interest 0 0 0 0 0  Down, Depressed, Hopeless 0 0 0 0 0  PHQ - 2 Score 0 0 0 0 0  Altered sleeping 0 0 0 0   Tired, decreased energy 0 '1 1 1   '$ Change in appetite 0 0 0 0   Feeling bad or failure about yourself  0 0 0 0   Trouble concentrating 0 0 0 0   Moving slowly or fidgety/restless 0 0 0 0   Suicidal thoughts 0 0 0 0   PHQ-9 Score 0 '1 1 1   '$ Difficult doing work/chores Not difficult at all Not difficult at all Not difficult at all Not difficult at all     Past Medical History:  Past Medical History:  Diagnosis Date   Diverticulosis    Hyperlipidemia    Hypertension    Nephrolithiasis    Urticaria     Surgical History:  Past Surgical History:  Procedure Laterality Date   COLONOSCOPY WITH PROPOFOL N/A 01/27/2017   Procedure: COLONOSCOPY  WITH PROPOFOL;  Surgeon: Jonathon Bellows, MD;  Location: [facility] ENDOSCOPY;  Service: Gastroenterology;  Laterality: N/A;   EXTRACORPOREAL SHOCK WAVE LITHOTRIPSY Left 05/14/2022   Procedure: EXTRACORPOREAL SHOCK WAVE LITHOTRIPSY (ESWL);  Surgeon: Abbie Sons, MD;  Location: [facility] ORS;  Service: Urology;  Laterality: Left;    Medications:  Current Outpatient Medications on File Prior to Visit  Medication Sig   tamsulosin (FLOMAX) 0.4 MG CAPS capsule Take 1 capsule (0.4 mg total) by mouth daily.   No current facility-administered medications on file prior to visit.    Allergies:  Allergies  Allergen Reactions   Naproxen Hives   Robaxin [Methocarbamol] Rash    Social History:  Social History   Socioeconomic History   Marital status: Married    Spouse name: Not on file   Number of children: Not on file   Years of education: Not on file   Highest education level: Not on file  Occupational History   Not on file  Tobacco Use   Smoking status: Never    Passive exposure: Never   Smokeless tobacco: Never  Vaping Use   Vaping Use: Never used  Substance and Sexual Activity   Alcohol use: No   Drug use: No   Sexual activity:  Yes  Other Topics Concern   Not on file  Social History Narrative   Not on file   Social Determinants of Health   Financial Resource Strain: Not on file  Food Insecurity: Not on file  Transportation Needs: Not on file  Physical Activity: Not on file  Stress: Not on file  Social Connections: Not on file  Intimate Partner Violence: Not on file   Social History   Tobacco Use  Smoking Status Never   Passive exposure: Never  Smokeless Tobacco Never   Social History   Substance and Sexual Activity  Alcohol Use No    Family History:  Family History  Problem Relation Age of Onset   Hypertension Mother    Cancer Father        throat   COPD Father    AAA (abdominal aortic aneurysm) Father    Aneurysm Father        brain    Past medical  history, surgical history, medications, allergies, family history and social history reviewed with patient today and changes made to appropriate areas of the chart.   Review of Systems  Constitutional: Negative.   HENT: Negative.    Eyes: Negative.   Respiratory: Negative.    Cardiovascular: Negative.   Gastrointestinal: Negative.   Genitourinary: Negative.   Musculoskeletal: Negative.   Skin: Negative.   Neurological: Negative.   Endo/Heme/Allergies: Negative.   Psychiatric/Behavioral: Negative.     All other ROS negative except what is listed above and in the HPI.      Objective:    BP 127/83   Pulse 73   Temp 98.4 F (36.9 C) (Oral)   Wt 202 lb (91.6 kg)   SpO2 97%   BMI 27.40 kg/m   Wt Readings from Last 3 Encounters:  06/08/22 202 lb (91.6 kg)  06/04/22 200 lb (90.7 kg)  05/14/22 199 lb 15.3 oz (90.7 kg)    Physical Exam  Results for orders placed or performed during the hospital encounter of 05/12/22  Urine Culture (for pregnant, neutropenic or urologic patients or patients with an indwelling urinary catheter)   Specimen: Urine, Random  Result Value Ref Range   Specimen Description      URINE, RANDOM Performed at [facility], [address] [place], [place] [address]    Special Requests      NONE Performed at [facility], [address], [place], [place] [address]    Culture      NO GROWTH Performed at [facility], [place] [address], [place], [place] [address]    Report Status 05/13/2022 FINAL       Assessment & Plan:   Problem List Items Addressed This Visit       Cardiovascular and Mediastinum   Hypertension    Doing well off medicine. Continue to monitor. Call with any concerns.       Relevant Orders   Comprehensive metabolic panel   Microalbumin, Urine Waived     Other   Hyperlipidemia    Rechecking labs today. Await results. Treat as needed.       Relevant Orders   Comprehensive metabolic panel   Lipid  Panel w/o Chol/HDL Ratio   Other Visit Diagnoses     Routine general medical examination at a health care facility    -  Primary   Vaccines up to date/declined. Screening labs checked today. Colonoscopy ordered. Continue diet and exercise. Call with any concerns.   Relevant Orders   Comprehensive  metabolic panel   CBC with Differential/Platelet   Lipid Panel w/o Chol/HDL Ratio   PSA   TSH   Urinalysis, Routine w reflex microscopic   Microalbumin, Urine Waived   Screening for colon cancer       Referral to GI placed today.   Relevant Orders   Ambulatory referral to Gastroenterology       LABORATORY TESTING:  Health maintenance labs ordered today as discussed above.   The natural history of prostate cancer and ongoing controversy regarding screening and potential treatment outcomes of prostate cancer has been discussed with the patient. The meaning of a false positive PSA and a false negative PSA has been discussed. He indicates understanding of the limitations of this screening test and wishes to proceed with screening PSA testing.   IMMUNIZATIONS:   - Tdap: Tetanus vaccination status reviewed: last tetanus booster within 10 years. - Influenza: Refused - Pneumovax: Not applicable - Prevnar: Not applicable - COVID: Refused - HPV: Not applicable - Shingrix vaccine: Refused  SCREENING: - Colonoscopy: Ordered today  Discussed with patient purpose of the colonoscopy is to detect colon cancer at curable precancerous or early stages   PATIENT COUNSELING:    Sexuality: Discussed sexually transmitted diseases, partner selection, use of condoms, avoidance of unintended pregnancy  and contraceptive alternatives.   Advised to avoid cigarette smoking.  I discussed with the patient that most people either abstain from alcohol or drink within safe limits (<=14/week and <=4 drinks/occasion for males, <=7/weeks and <= 3 drinks/occasion for females) and that the risk for alcohol disorders  and other health effects rises proportionally with the number of drinks per week and how often a drinker exceeds daily limits.  Discussed cessation/primary prevention of drug use and availability of treatment for abuse.   Diet: Encouraged to adjust caloric intake to maintain  or achieve ideal body weight, to reduce intake of dietary saturated fat and total fat, to limit sodium intake by avoiding high sodium foods and not adding table salt, and to maintain adequate dietary potassium and calcium preferably from fresh fruits, vegetables, and low-fat dairy products.    stressed the importance of regular exercise  Injury prevention: Discussed safety belts, safety helmets, smoke detector, smoking near bedding or upholstery.   Dental health: Discussed importance of regular tooth brushing, flossing, and dental visits.   Follow up plan: NEXT PREVENTATIVE PHYSICAL DUE IN 1 YEAR. Return in about 1 year (around 06/08/2023) for physical.

## 2022-06-09 LAB — CBC WITH DIFFERENTIAL/PLATELET
Basophils Absolute: 0 10*3/uL (ref 0.0–0.2)
Basos: 0 %
EOS (ABSOLUTE): 0.1 10*3/uL (ref 0.0–0.4)
Eos: 1 %
Hematocrit: 46.3 % (ref 37.5–51.0)
Hemoglobin: 15.1 g/dL (ref 13.0–17.7)
Immature Grans (Abs): 0 10*3/uL (ref 0.0–0.1)
Immature Granulocytes: 0 %
Lymphocytes Absolute: 1.3 10*3/uL (ref 0.7–3.1)
Lymphs: 12 %
MCH: 28.5 pg (ref 26.6–33.0)
MCHC: 32.6 g/dL (ref 31.5–35.7)
MCV: 88 fL (ref 79–97)
Monocytes Absolute: 0.6 10*3/uL (ref 0.1–0.9)
Monocytes: 6 %
Neutrophils Absolute: 8.5 10*3/uL — ABNORMAL HIGH (ref 1.4–7.0)
Neutrophils: 81 %
Platelets: 211 10*3/uL (ref 150–450)
RBC: 5.29 x10E6/uL (ref 4.14–5.80)
RDW: 13 % (ref 11.6–15.4)
WBC: 10.6 10*3/uL (ref 3.4–10.8)

## 2022-06-09 LAB — COMPREHENSIVE METABOLIC PANEL
ALT: 18 IU/L (ref 0–44)
AST: 21 IU/L (ref 0–40)
Albumin/Globulin Ratio: 1.9 (ref 1.2–2.2)
Albumin: 4.4 g/dL (ref 3.8–4.9)
Alkaline Phosphatase: 61 IU/L (ref 44–121)
BUN/Creatinine Ratio: 13 (ref 9–20)
BUN: 15 mg/dL (ref 6–24)
Bilirubin Total: 0.2 mg/dL (ref 0.0–1.2)
CO2: 23 mmol/L (ref 20–29)
Calcium: 9 mg/dL (ref 8.7–10.2)
Chloride: 107 mmol/L — ABNORMAL HIGH (ref 96–106)
Creatinine, Ser: 1.19 mg/dL (ref 0.76–1.27)
Globulin, Total: 2.3 g/dL (ref 1.5–4.5)
Glucose: 103 mg/dL — ABNORMAL HIGH (ref 70–99)
Potassium: 4.8 mmol/L (ref 3.5–5.2)
Sodium: 144 mmol/L (ref 134–144)
Total Protein: 6.7 g/dL (ref 6.0–8.5)
eGFR: 73 mL/min/{1.73_m2} (ref >59)

## 2022-06-09 LAB — LIPID PANEL W/O CHOL/HDL RATIO
Cholesterol, Total: 225 mg/dL — ABNORMAL HIGH (ref 100–199)
HDL: 41 mg/dL (ref >39)
LDL Chol Calc (NIH): 155 mg/dL — ABNORMAL HIGH (ref 0–99)
Triglycerides: 157 mg/dL — ABNORMAL HIGH (ref 0–149)
VLDL Cholesterol Cal: 29 mg/dL (ref 5–40)

## 2022-06-09 LAB — TSH: TSH: 0.433 u[IU]/mL — ABNORMAL LOW (ref 0.450–4.500)

## 2022-06-09 LAB — PSA: Prostate Specific Ag, Serum: 1.2 ng/mL (ref 0.0–4.0)

## 2022-06-12 LAB — CALCULI, WITH PHOTOGRAPH (CLINICAL LAB)
Calcium Oxalate Dihydrate: 70 %
Calcium Oxalate Monohydrate: 30 %
Weight Calculi: 20 mg

## 2022-06-15 ENCOUNTER — Telehealth: Payer: Self-pay | Admitting: *Deleted

## 2022-06-15 ENCOUNTER — Other Ambulatory Visit: Payer: Self-pay | Admitting: *Deleted

## 2022-06-15 DIAGNOSIS — Z1211 Encounter for screening for malignant neoplasm of colon: Secondary | ICD-10-CM

## 2022-06-15 MED ORDER — NA SULFATE-K SULFATE-MG SULF 17.5-3.13-1.6 GM/177ML PO SOLN: 1.0000 | Freq: Once | ORAL | 0 refills | Status: AC

## 2022-06-15 NOTE — Telephone Encounter (Signed)
Gastroenterology Pre-Procedure Review  Request Date: 09/11/2022 Requesting Physician: Dr. Vicente Males  PATIENT REVIEW QUESTIONS: The patient responded to the following health history questions as indicated:    1. Are you having any GI issues? no 2. Do you have a personal history of Polyps? yes (01/27/2017) 3. Do you have a family history of Colon Cancer or Polyps? no 4. Diabetes Mellitus? no 5. Joint replacements in the past 12 months?no 6. Major health problems in the past 3 months?no 7. Any artificial heart valves, MVP, or defibrillator?no    MEDICATIONS & ALLERGIES:    Patient reports the following regarding taking any anticoagulation/antiplatelet therapy:   Plavix, Coumadin, Eliquis, Xarelto, Lovenox, Pradaxa, Brilinta, or Effient? no Aspirin? no  Patient confirms/reports the following medications:  Current Outpatient Medications  Medication Sig Dispense Refill   cyclobenzaprine (FLEXERIL) 10 MG tablet Take 1 tablet (10 mg total) by mouth at bedtime. 30 tablet 6   SUMAtriptan (IMITREX) 100 MG tablet Take 1 tablet (100 mg total) by mouth every 2 (two) hours as needed for migraine. May repeat in 2 hours if headache persists or recurs. 10 tablet 12   tamsulosin (FLOMAX) 0.4 MG CAPS capsule Take 1 capsule (0.4 mg total) by mouth daily. 30 capsule 0   No current facility-administered medications for this visit.    Patient confirms/reports the following allergies:  Allergies  Allergen Reactions   Naproxen Hives   Robaxin [Methocarbamol] Rash    No orders of the defined types were placed in this encounter.   AUTHORIZATION INFORMATION Primary Insurance: 1D#: Group #:  Secondary Insurance: 1D#: Group #:  SCHEDULE INFORMATION: Date: 09/11/2022 Time: Location: [place]

## 2022-07-02 ENCOUNTER — Ambulatory Visit: Payer: BC Managed Care – PPO | Admitting: Urology

## 2022-08-14 IMAGING — MR MR ABDOMEN WO/W CM
17 series · 48 of 48 positions shown · IV contrast (9ml Gadavist)
Comparison: None.

CLINICAL DATA: Evaluate liver mass found on recent CT scan

EXAM:
MRI ABDOMEN WITHOUT AND WITH CONTRAST
TECHNIQUE: Multiplanar multisequence MR imaging of the abdomen was performed
both before and after the administration of intravenous contrast.
CONTRAST:  9mL GADAVIST GADOBUTROL 1 MMOL/ML IV SOLN

[Series 3: T2 · coronal · 6.5mm · 1.19mm/px · 2 of 33 slices shown (1 of 2)]
[im 1/33]
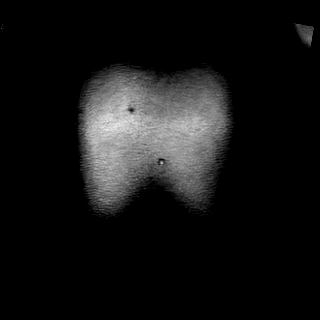
[im 33/33]
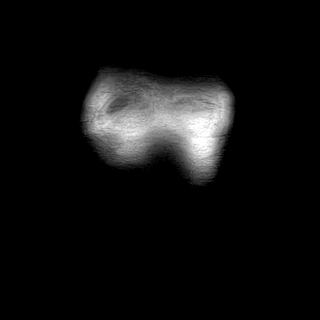

[Series 4: T2 · axial · 6.5mm · 1.19mm/px · z∈[-54,+188]mm · 2 of 32 slices shown (2 of 2)]
[im 1/32]
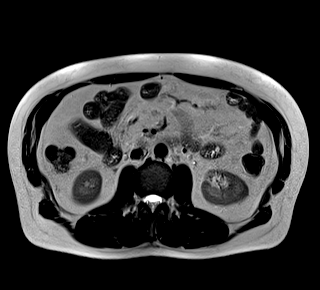
[im 32/32]
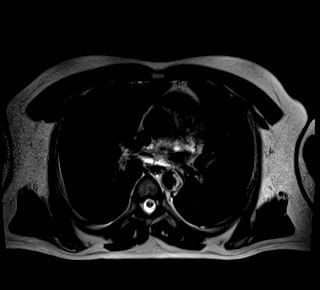

[Series 6: T2 fat-sat · axial · 6.0mm · 1.19mm/px · z∈[-52,+186]mm · 2 of 34 slices shown]
[im 1/34]
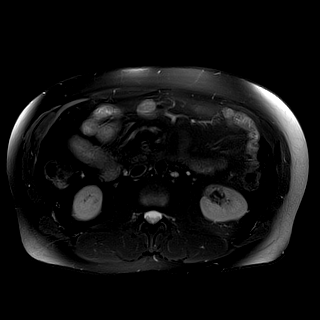
[im 34/34]
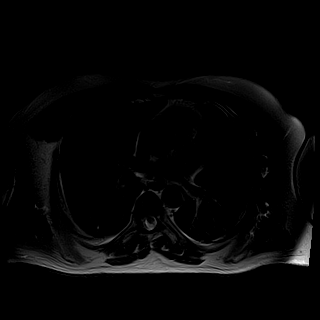

[Series 7: ax dwi_tracew · axial · 6.0mm · 1.42mm/px · z∈[-52,+186]mm · 5 of 102 slices shown]
[im 1/102]
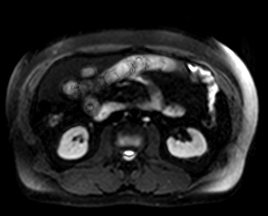
[im 26/102]
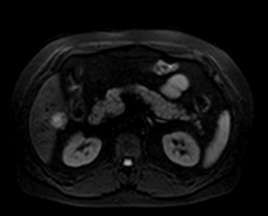
[im 51/102]
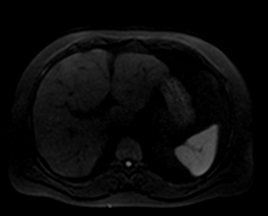
[im 76/102]
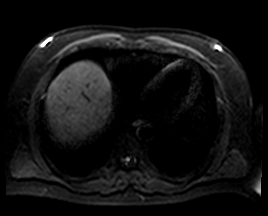
[im 102/102]
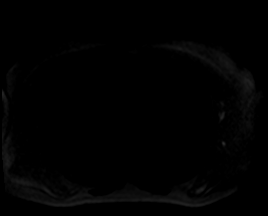

[Series 8: ax dwi_adc · axial · 6.0mm · 1.42mm/px · z∈[-52,+186]mm · 2 of 34 slices shown]
[im 1/34]
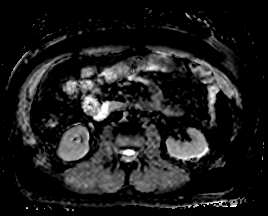
[im 34/34]
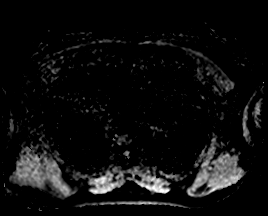

[Series 9: T1 · axial · 6.5mm · 0.74mm/px · z∈[-54,+188]mm · 4 of 64 slices shown]
[im 1/64]
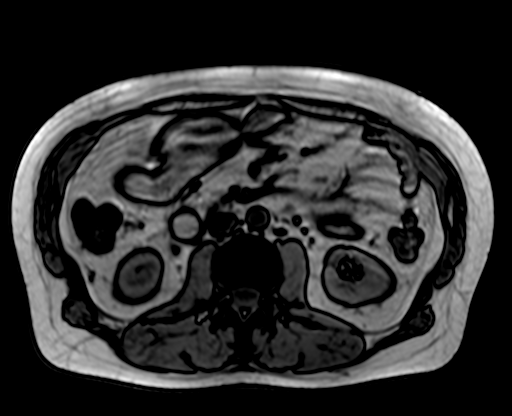
[im 22/64]
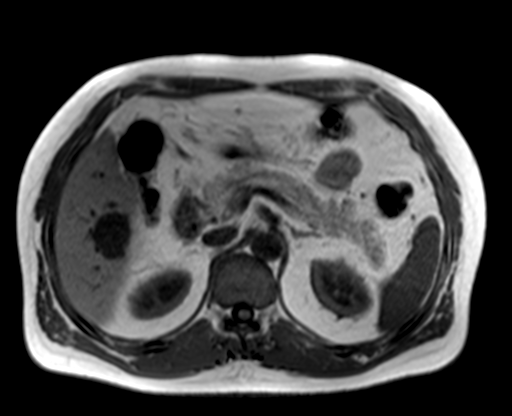
[im 43/64]
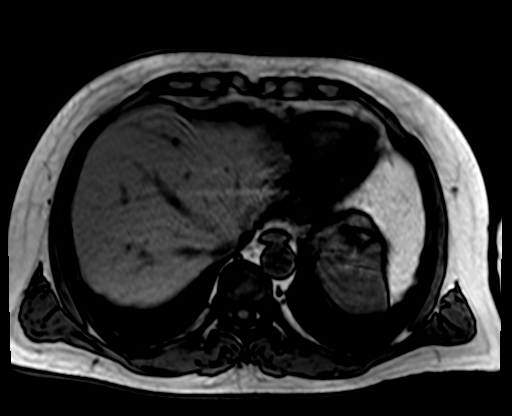
[im 64/64]
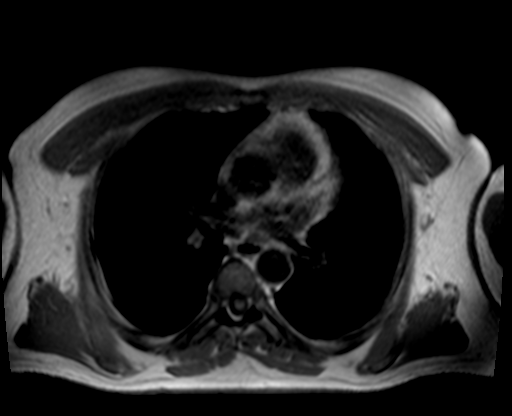

[Series 10: bSSFP · axial · 6.5mm · 0.74mm/px · 1 of 32 slices shown]
[im 1/32]
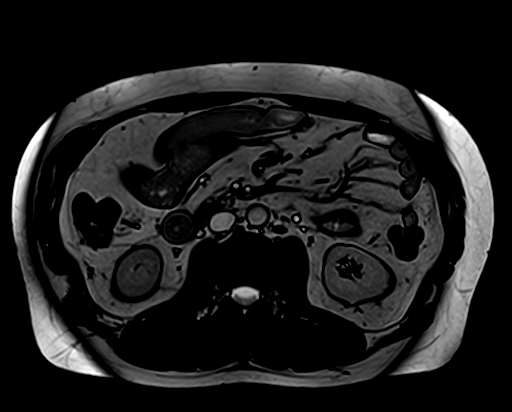

[Series 11: T1 dynamic fat-sat · axial · non-contrast · 3.2mm · 1.19mm/px · z∈[-60,+193]mm · 3 of 80 slices shown (1 of 5)]
[im 1/80]
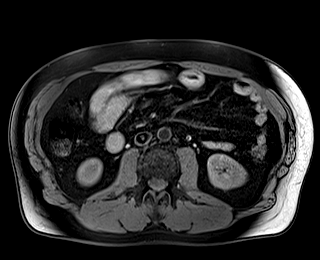
[im 40/80]
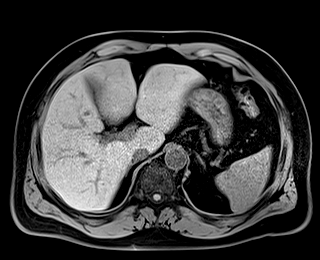
[im 80/80]
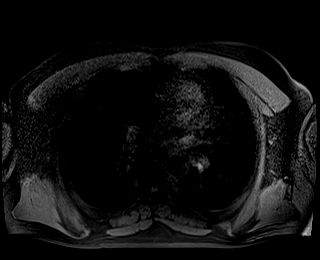

[Series 12: T1 dynamic fat-sat post-contrast · axial · 3.2mm · 1.19mm/px · z∈[-60,+193]mm · 3 of 80 slices shown (1 of 4)]
[im 1/80]
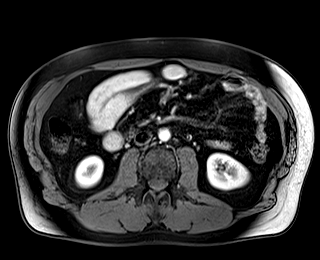
[im 40/80]
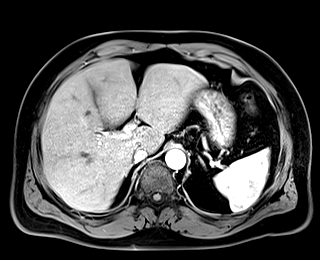
[im 80/80]
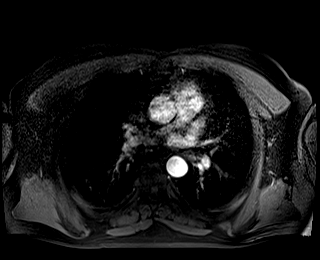

[Series 13: T1 dynamic fat-sat · axial · 3.2mm · 1.19mm/px · z∈[-60,+193]mm · 3 of 80 slices shown (2 of 5)]
[im 1/80]
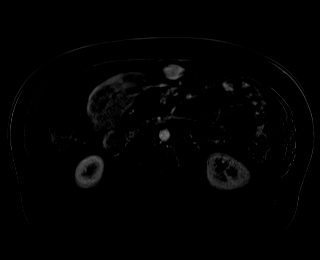
[im 40/80]
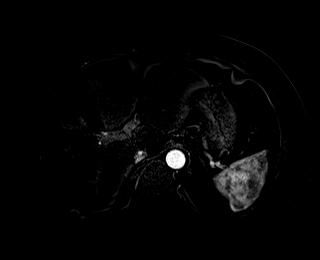
[im 80/80]
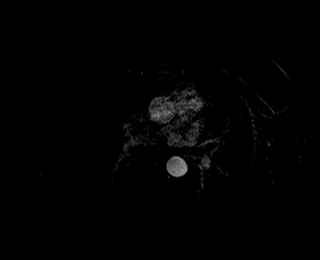

[Series 14: T1 dynamic fat-sat post-contrast · axial · 3.2mm · 1.19mm/px · z∈[-60,+193]mm · 3 of 80 slices shown (2 of 4)]
[im 1/80]
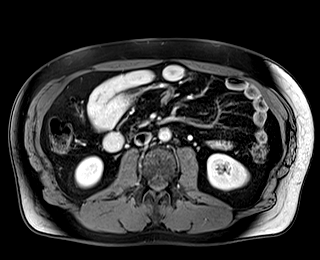
[im 40/80]
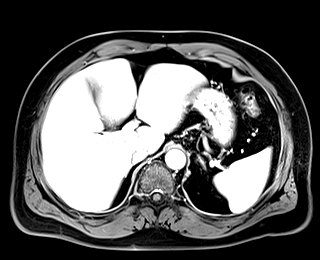
[im 80/80]
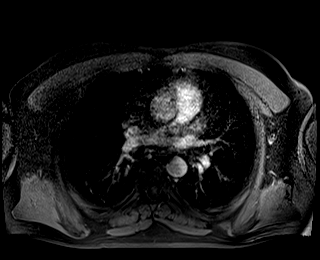

[Series 15: T1 dynamic fat-sat · axial · 3.2mm · 1.19mm/px · z∈[-60,+193]mm · 3 of 80 slices shown (3 of 5)]
[im 1/80]
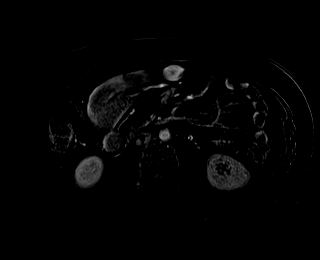
[im 40/80]
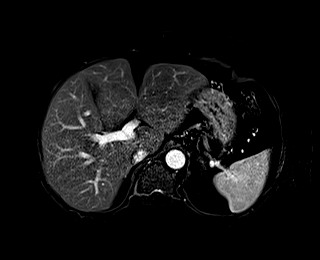
[im 80/80]
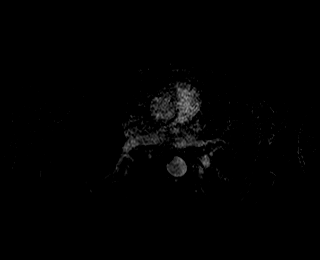

[Series 16: T1 dynamic fat-sat post-contrast · axial · 3.2mm · 1.19mm/px · z∈[-60,+193]mm · 3 of 80 slices shown (3 of 4)]
[im 1/80]
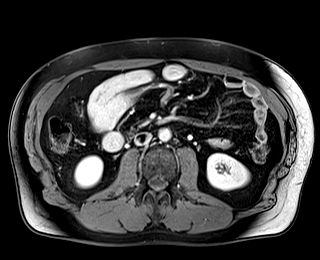
[im 40/80]
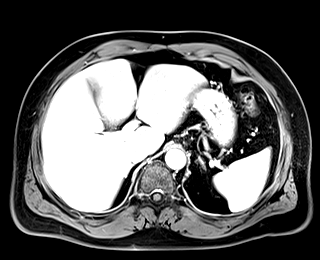
[im 80/80]
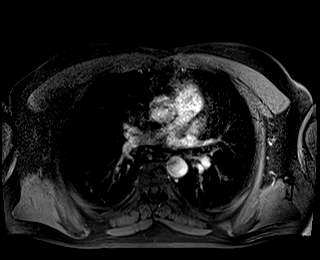

[Series 17: T1 dynamic fat-sat · axial · 3.2mm · 1.19mm/px · z∈[-60,+193]mm · 3 of 80 slices shown (4 of 5)]
[im 1/80]
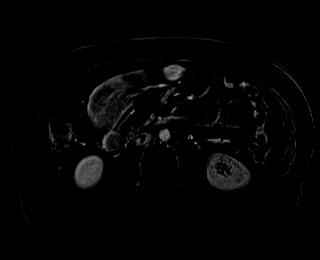
[im 40/80]
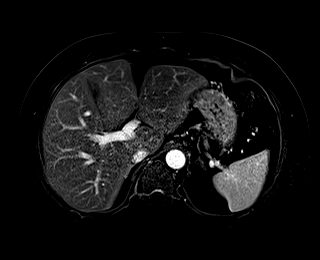
[im 80/80]
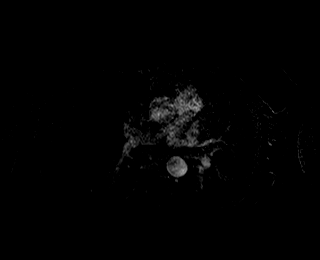

[Series 18: T1 dynamic post-contrast · coronal · 3.0mm · 1.31mm/px · 3 of 72 slices shown]
[im 1/72]
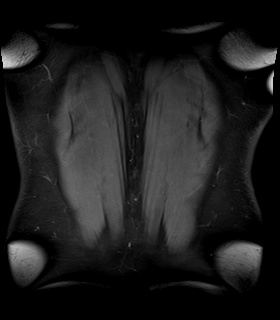
[im 36/72]
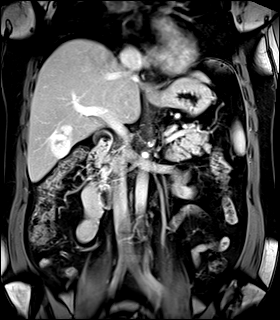
[im 72/72]
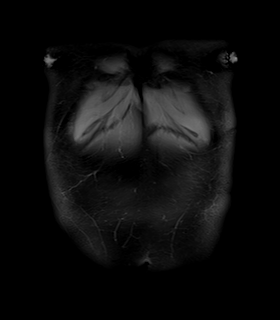

[Series 19: T1 dynamic fat-sat post-contrast · axial · 3.2mm · 1.19mm/px · z∈[-60,+193]mm · 3 of 80 slices shown (4 of 4)]
[im 1/80]
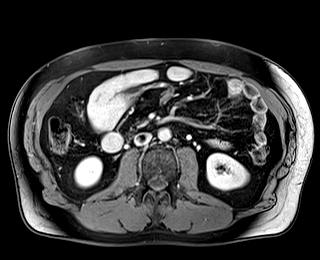
[im 40/80]
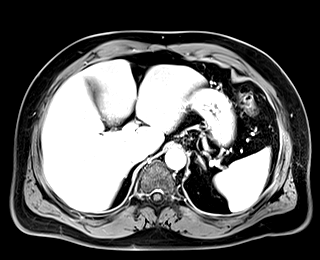
[im 80/80]
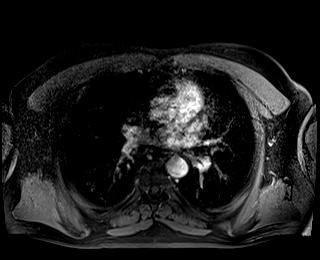

[Series 20: T1 dynamic fat-sat · axial · 3.2mm · 1.19mm/px · z∈[-60,+193]mm · 3 of 80 slices shown (5 of 5)]
[im 1/80]
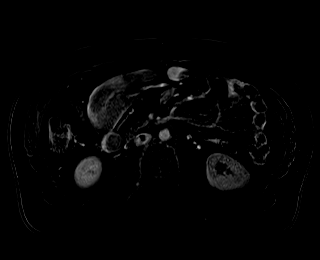
[im 40/80]
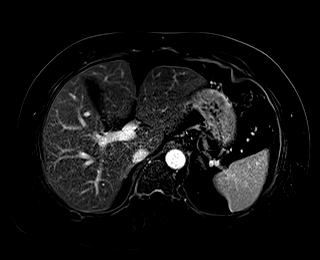
[im 80/80]
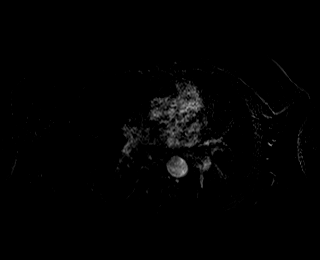

[48 of 48 positions shown; findings below may reference images not displayed]

FINDINGS: Lower chest: No acute findings.

Hepatobiliary: Within the posterior right lobe of liver there is a
T2 hyperintense, T1 hypointense lobulated mass which measures 4.0 x
2.9 cm, image [DATE]. This has early peripheral discontinuous
enhancement with delayed fill-in compatible with a benign liver
hemangioma.No additional liver abnormalities

Pancreas: No mass, inflammatory changes, or other parenchymal
abnormality identified.

Spleen:  Within normal limits in size and appearance.

Adrenals/Urinary Tract: Normal appearance of the adrenal glands.
Small cystic lesion arising from the lateral cortex of the right mid
kidney measures 0.5 x 0.7 cm, image [DATE]. This is technically too
small to reliably characterize but likely represents a small cyst.
No solid enhancing mass or hydronephrosis identified bilaterally.

Stomach/Bowel: Visualized portions within the abdomen are
unremarkable.

Vascular/Lymphatic: No pathologically enlarged lymph nodes
identified. No abdominal aortic aneurysm demonstrated.

Other:  None.

Musculoskeletal: No suspicious bone lesions identified.
IMPRESSION: Posterior right hepatic lobe mass has signal and enhancement
characteristics compatible with a benign liver hemangioma. No
suspicious liver mass identified.

## 2022-09-11 ENCOUNTER — Ambulatory Visit
Admission: RE | Admit: 2022-09-11 | Discharge: 2022-09-11 | Disposition: A | Discharge status: Home / Self Care | Payer: BC Managed Care – PPO | Attending: Gastroenterology | Admitting: Gastroenterology

## 2022-09-11 ENCOUNTER — Encounter
Admission: RE | Disposition: A | Discharge status: Home / Self Care | Payer: Self-pay | Source: Home / Self Care | Attending: Gastroenterology

## 2022-09-11 ENCOUNTER — Ambulatory Visit: Payer: BC Managed Care – PPO | Admitting: Anesthesiology

## 2022-09-11 ENCOUNTER — Encounter: Payer: Self-pay | Admitting: Gastroenterology

## 2022-09-11 DIAGNOSIS — Z09 Encounter for follow-up examination after completed treatment for conditions other than malignant neoplasm: Secondary | ICD-10-CM | POA: Insufficient documentation

## 2022-09-11 DIAGNOSIS — Z1211 Encounter for screening for malignant neoplasm of colon: Secondary | ICD-10-CM | POA: Diagnosis present

## 2022-09-11 DIAGNOSIS — Z86010 Personal history of colonic polyps: Secondary | ICD-10-CM | POA: Diagnosis not present

## 2022-09-11 DIAGNOSIS — I10 Essential (primary) hypertension: Secondary | ICD-10-CM | POA: Diagnosis not present

## 2022-09-11 DIAGNOSIS — K5730 Diverticulosis of large intestine without perforation or abscess without bleeding: Secondary | ICD-10-CM | POA: Diagnosis not present

## 2022-09-11 HISTORY — PX: COLONOSCOPY WITH PROPOFOL: SHX5780

## 2022-09-11 HISTORY — DX: Personal history of urinary calculi: Z87.442

## 2022-09-11 SURGERY — COLONOSCOPY WITH PROPOFOL
Anesthesia: General

## 2022-09-11 MED ORDER — SODIUM CHLORIDE 0.9 % IV SOLN: INTRAVENOUS | Status: DC

## 2022-09-11 MED ORDER — LIDOCAINE HCL (CARDIAC) PF 100 MG/5ML IV SOSY
PREFILLED_SYRINGE | INTRAVENOUS | Status: DC | PRN
  Administered 2022-09-11: 100 mg via INTRAVENOUS

## 2022-09-11 MED ORDER — PROPOFOL 500 MG/50ML IV EMUL
INTRAVENOUS | Status: DC | PRN
  Administered 2022-09-11: 150 ug/kg/min via INTRAVENOUS
  Administered 2022-09-11: 50 mg via INTRAVENOUS

## 2022-09-11 NOTE — Transfer of Care (Signed)
Immediate Anesthesia Transfer of Care Note  Patient: Nathaniel Young.  Procedure(s) Performed: COLONOSCOPY WITH PROPOFOL  Patient Location: PACU  Anesthesia Type:General  Level of Consciousness: drowsy  Airway & Oxygen Therapy: Patient Spontanous Breathing  Post-op Assessment: Report given to RN and Post -op Vital signs reviewed and stable  Post vital signs: stable  Last Vitals:  Vitals Value Taken Time  BP 118/86 09/11/22 1023  Temp 36.1 C 09/11/22 1023  Pulse 77 09/11/22 1024  Resp 14 09/11/22 1024  SpO2 96 % 09/11/22 1024  Vitals shown include unvalidated device data.  Last Pain:  Vitals:   09/11/22 1023  TempSrc: Temporal  PainSc: Asleep         Complications: No notable events documented.

## 2022-09-11 NOTE — Anesthesia Postprocedure Evaluation (Signed)
Anesthesia Post Note  Patient: Nathaniel Young.  Procedure(s) Performed: COLONOSCOPY WITH PROPOFOL  Patient location during evaluation: PACU Anesthesia Type: General Level of consciousness: awake and alert, oriented and patient cooperative Pain management: pain level controlled Vital Signs Assessment: post-procedure vital signs reviewed and stable Respiratory status: spontaneous breathing, nonlabored ventilation and respiratory function stable Cardiovascular status: blood pressure returned to baseline and stable Postop Assessment: adequate PO intake Anesthetic complications: no   There were no known notable events for this encounter.   Last Vitals:  Vitals:   09/11/22 1033 09/11/22 1040  BP: 117/87 123/80  Pulse: 80 73  Resp: 17 15  Temp:    SpO2: 96% 98%    Last Pain:  Vitals:   09/11/22 1040  TempSrc:   PainSc: 0-No pain                 [place]ed Breech

## 2022-09-11 NOTE — Op Note (Signed)
[facility] Gastroenterology Patient Name: Nathaniel Young Procedure Date: 09/11/2022 10:06 AM MRN: [id number] Account #: [id number] Date of Birth: [birth year] Admit Type: Outpatient Age: 52 Room: [facility] ENDO ROOM 4 Gender: Male Note Status: Finalized Instrument Name: Prentice Docker [id number] Procedure:             Colonoscopy Indications:           Surveillance: Personal history of adenomatous polyps                         on last colonoscopy 5 years ago Providers:             Wyline Mood MD, MD Referring MD:          Dorcas Carrow (Referring MD) Medicines:             Monitored Anesthesia Care Complications:         No immediate complications. Procedure:             Pre-Anesthesia Assessment:                        - Prior to the procedure, a History and Physical was                         performed, and patient medications, allergies and                         sensitivities were reviewed. The patient's tolerance                         of previous anesthesia was reviewed.                        - The risks and benefits of the procedure and the                         sedation options and risks were discussed with the                         patient. All questions were answered and informed                         consent was obtained.                        - ASA Grade Assessment: II - A patient with mild                         systemic disease.                        After obtaining informed consent, the colonoscope was                         passed under direct vision. Throughout the procedure,                         the patient's blood pressure, pulse, and oxygen                         saturations  were monitored continuously. The                         Colonoscope was introduced through the anus and                         advanced to the the cecum, identified by the                         appendiceal orifice. The colonoscopy was performed                          with ease. The patient tolerated the procedure well.                         The quality of the bowel preparation was excellent.                         The ileocecal valve, appendiceal orifice, and rectum                         were photographed. Findings:      The perianal and digital rectal examinations were normal.      Multiple medium-mouthed diverticula were found in the sigmoid colon.      The exam was otherwise without abnormality on direct and retroflexion       views. Impression:            - Diverticulosis in the sigmoid colon.                        - The examination was otherwise normal on direct and                         retroflexion views.                        - No specimens collected. Recommendation:        - Discharge patient to home (with escort).                        - Resume previous diet.                        - Continue present medications.                        - Repeat colonoscopy in 10 years for surveillance. Procedure Code(s):     --- Professional ---                        [phone], Colonoscopy, flexible; diagnostic, including                         collection of specimen(s) by brushing or washing, when                         performed (separate procedure) Diagnosis Code(s):     --- Professional ---  Z86.010, Personal history of colonic polyps                        K57.30, Diverticulosis of large intestine without                         perforation or abscess without bleeding CPT copyright 2022 American [employer] Association. All rights reserved. The codes documented in this report are preliminary and upon coder review may  be revised to meet current compliance requirements. Wyline Mood, MD Wyline Mood MD, MD 09/11/2022 10:23:15 AM This report has been signed electronically. Number of Addenda: 0 Note Initiated On: 09/11/2022 10:06 AM Scope Withdrawal Time: 0 hours 8 minutes 29 seconds  Total Procedure Duration: 0 hours 11  minutes 28 seconds  Estimated Blood Loss:  Estimated blood loss: none.      [facility]

## 2022-09-11 NOTE — H&P (Signed)
Wyline Mood, MD [address], [place], [place], [address] [address], [place], [place], [address] Phone: [phone]  Fax: [phone]  Primary Care Physician:  Dorcas Carrow, DO   Pre-Procedure History & Physical: HPI:  Nathaniel Young. is a 52 y.o. male is here for an colonoscopy.   Past Medical History:  Diagnosis Date   Diverticulosis    Hyperlipidemia    Hypertension    Nephrolithiasis    Urticaria     Past Surgical History:  Procedure Laterality Date   COLONOSCOPY WITH PROPOFOL N/A 01/27/2017   Procedure: COLONOSCOPY WITH PROPOFOL;  Surgeon: Wyline Mood, MD;  Location: [facility] ENDOSCOPY;  Service: Gastroenterology;  Laterality: N/A;   EXTRACORPOREAL SHOCK WAVE LITHOTRIPSY Left 05/14/2022   Procedure: EXTRACORPOREAL SHOCK WAVE LITHOTRIPSY (ESWL);  Surgeon: Riki Altes, MD;  Location: [facility] ORS;  Service: Urology;  Laterality: Left;    Prior to Admission medications   Medication Sig Start Date End Date Taking? Authorizing Provider  cyclobenzaprine (FLEXERIL) 10 MG tablet Take 1 tablet (10 mg total) by mouth at bedtime. 06/08/22   Johnson, Megan P, DO  SUMAtriptan (IMITREX) 100 MG tablet Take 1 tablet (100 mg total) by mouth every 2 (two) hours as needed for migraine. May repeat in 2 hours if headache persists or recurs. 06/08/22   Johnson, Megan P, DO  tamsulosin (FLOMAX) 0.4 MG CAPS capsule Take 1 capsule (0.4 mg total) by mouth daily. Patient not taking: Reported on 09/07/2022 06/04/22   Michiel Cowboy A, PA-C    Allergies as of 06/15/2022 - Review Complete 06/08/2022  Allergen Reaction Noted   Naproxen Hives 12/10/2019   Robaxin [methocarbamol] Rash 07/17/2014    Family History  Problem Relation Age of Onset   Hypertension Mother    Cancer Father        throat   COPD Father    AAA (abdominal aortic aneurysm) Father    Aneurysm Father        brain    Social History   Socioeconomic History   Marital status: Married    Spouse  name: Not on file   Number of children: Not on file   Years of education: Not on file   Highest education level: Not on file  Occupational History   Not on file  Tobacco Use   Smoking status: Never    Passive exposure: Never   Smokeless tobacco: Never  Vaping Use   Vaping Use: Never used  Substance and Sexual Activity   Alcohol use: No   Drug use: No   Sexual activity: Yes  Other Topics Concern   Not on file  Social History Narrative   Not on file   Social Determinants of Health   Financial Resource Strain: Not on file  Food Insecurity: Not on file  Transportation Needs: Not on file  Physical Activity: Not on file  Stress: Not on file  Social Connections: Not on file  Intimate Partner Violence: Not on file    Review of Systems: See HPI, otherwise negative ROS  Physical Exam: There were no vitals taken for this visit. General:   Alert,  pleasant and cooperative in NAD Head:  Normocephalic and atraumatic. Neck:  Supple; no masses or thyromegaly. Lungs:  Clear throughout to auscultation, normal respiratory effort.    Heart:  +S1, +S2, Regular rate and rhythm, No edema. Abdomen:  Soft, nontender and nondistended. Normal bowel sounds, without guarding, and without rebound.   Neurologic:  Alert and  oriented x4;  grossly normal neurologically.  Impression/Plan: Nathaniel Young. is here for an colonoscopy to be performed for surveillance due to prior history of colon polyps   Risks, benefits, limitations, and alternatives regarding  colonoscopy have been reviewed with the patient.  Questions have been answered.  All parties agreeable.   Wyline Mood, MD  09/11/2022, 9:21 AM

## 2022-09-11 NOTE — Anesthesia Preprocedure Evaluation (Addendum)
Anesthesia Evaluation  Patient identified by MRN, date of birth, ID band Patient awake    Reviewed: Allergy & Precautions, NPO status , Patient's Chart, lab work & pertinent test results  History of Anesthesia Complications Negative for: history of anesthetic complications  Airway Mallampati: III   Neck ROM: Full    Dental  (+) Missing   Pulmonary neg pulmonary ROS   Pulmonary exam normal breath sounds clear to auscultation       Cardiovascular hypertension, Normal cardiovascular exam Rhythm:Regular Rate:Normal  ECG 01/09/22: NSR   Neuro/Psych negative neurological ROS     GI/Hepatic negative GI ROS,,,  Endo/Other  negative endocrine ROS    Renal/GU Renal disease (nephrolithiasis)     Musculoskeletal   Abdominal   Peds  Hematology negative hematology ROS (+)   Anesthesia Other Findings   Reproductive/Obstetrics                             Anesthesia Physical Anesthesia Plan  ASA: 2  Anesthesia Plan: General   Post-op Pain Management:    Induction: Intravenous  PONV Risk Score and Plan: 2 and Propofol infusion, TIVA and Treatment may vary due to age or medical condition  Airway Management Planned: Natural Airway  Additional Equipment:   Intra-op Plan:   Post-operative Plan:   Informed Consent: I have reviewed the patients History and Physical, chart, labs and discussed the procedure including the risks, benefits and alternatives for the proposed anesthesia with the patient or authorized representative who has indicated his/her understanding and acceptance.       Plan Discussed with: CRNA  Anesthesia Plan Comments: (LMA/GETA backup discussed.  Patient consented for risks of anesthesia including but not limited to:  - adverse reactions to medications - damage to eyes, teeth, lips or other oral mucosa - nerve damage due to positioning  - sore throat or hoarseness -  damage to heart, brain, nerves, lungs, other parts of body or loss of life  Informed patient about role of CRNA in peri- and intra-operative care.  Patient voiced understanding.)        Anesthesia Quick Evaluation

## 2022-09-14 ENCOUNTER — Encounter: Payer: Self-pay | Admitting: Gastroenterology

## 2023-06-09 ENCOUNTER — Encounter: Payer: Self-pay | Admitting: Family Medicine

## 2023-07-05 ENCOUNTER — Ambulatory Visit: Payer: Self-pay | Admitting: Family Medicine

## 2023-07-05 ENCOUNTER — Encounter: Payer: Self-pay | Admitting: Family Medicine

## 2023-07-05 VITALS — BP 146/88 | HR 84 | Temp 98.4°F | Ht 72.0 in | Wt 200.0 lb

## 2023-07-05 DIAGNOSIS — I10 Essential (primary) hypertension: Secondary | ICD-10-CM

## 2023-07-05 DIAGNOSIS — E782 Mixed hyperlipidemia: Secondary | ICD-10-CM

## 2023-07-05 DIAGNOSIS — Z0000 Encounter for general adult medical examination without abnormal findings: Secondary | ICD-10-CM

## 2023-07-05 LAB — MICROALBUMIN, URINE WAIVED
Creatinine, Urine Waived: 300 mg/dL (ref 10–300)
Microalb, Ur Waived: 30 mg/L — ABNORMAL HIGH (ref 0–19)
Microalb/Creat Ratio: <30 mg/g (ref <30)

## 2023-07-05 MED ORDER — SUMATRIPTAN SUCCINATE 100 MG PO TABS: 100.0000 mg | ORAL_TABLET | ORAL | 12 refills | Status: AC | PRN

## 2023-07-05 MED ORDER — LISINOPRIL 2.5 MG PO TABS: 2.5000 mg | ORAL_TABLET | Freq: Every day | ORAL | 1 refills | Status: DC

## 2023-07-05 MED ORDER — CYCLOBENZAPRINE HCL 10 MG PO TABS: 10.0000 mg | ORAL_TABLET | Freq: Every day | ORAL | 6 refills | Status: AC

## 2023-07-05 NOTE — Assessment & Plan Note (Signed)
 Running high. Will start 2.5mg  lisinopril and recheck in 3 months. Call with any concerns.

## 2023-07-05 NOTE — Assessment & Plan Note (Signed)
 Rechecking labs today. Await results. Treat as needed.

## 2023-07-05 NOTE — Progress Notes (Signed)
 BP (!) 146/88   Pulse 84   Temp 98.4 F (36.9 C)   Ht 6' (1.829 m)   Wt 200 lb (90.7 kg)   SpO2 98%   BMI 27.12 kg/m    Subjective:    Patient ID: Nathaniel Young., male    DOB: May 03, 1970, 53 y.o.   MRN: [id number]  HPI: Nathaniel Young. is a 53 y.o. male presenting on 07/05/2023 for comprehensive medical examination. Current medical complaints include:  HYPERTENSION / HYPERLIPIDEMIA Satisfied with current treatment? yes Duration of hypertension: chronic BP monitoring frequency: rarely BP medication side effects: not on anything Past BP meds: lisinopril Duration of hyperlipidemia: chronic Cholesterol medication side effects: N/A Cholesterol supplements: none Past cholesterol medications: none Medication compliance: N/A Aspirin: no Recent stressors: no Recurrent headaches: no Visual changes: no Palpitations: no Dyspnea: no Chest pain: no Lower extremity edema: no Dizzy/lightheaded: no  He currently lives with: wife Interim Problems from his last visit: no  Depression Screen done today and results listed below:     07/05/2023    4:25 PM 06/08/2022    8:55 AM 01/20/2022    2:09 PM 01/09/2022   11:19 AM 06/06/2021    8:42 AM  Depression screen PHQ 2/9  Decreased Interest 0 0 0 0 0  Down, Depressed, Hopeless 0 0 0 0 0  PHQ - 2 Score 0 0 0 0 0  Altered sleeping 0 0 0 0 0  Tired, decreased energy 0 0 1 1 1   Change in appetite 0 0 0 0 0  Feeling bad or failure about yourself  0 0 0 0 0  Trouble concentrating 0 0 0 0 0  Moving slowly or fidgety/restless 0 0 0 0 0  Suicidal thoughts 0 0 0 0 0  PHQ-9 Score 0 0 1 1 1   Difficult doing work/chores  Not difficult at all Not difficult at all Not difficult at all Not difficult at all    Past Medical History:  Past Medical History:  Diagnosis Date   Diverticulosis    History of kidney stones    Hyperlipidemia    Hypertension    Urticaria     Surgical History:  Past Surgical History:  Procedure Laterality  Date   COLONOSCOPY WITH PROPOFOL N/A 01/27/2017   Procedure: COLONOSCOPY WITH PROPOFOL;  Surgeon: Wyline Mood, MD;  Location: [facility] ENDOSCOPY;  Service: Gastroenterology;  Laterality: N/A;   COLONOSCOPY WITH PROPOFOL N/A 09/11/2022   Procedure: COLONOSCOPY WITH PROPOFOL;  Surgeon: Wyline Mood, MD;  Location: [facility] ENDOSCOPY;  Service: Gastroenterology;  Laterality: N/A;   EXTRACORPOREAL SHOCK WAVE LITHOTRIPSY Left 05/14/2022   Procedure: EXTRACORPOREAL SHOCK WAVE LITHOTRIPSY (ESWL);  Surgeon: Riki Altes, MD;  Location: [facility] ORS;  Service: Urology;  Laterality: Left;    Medications:  Current Outpatient Medications on File Prior to Visit  Medication Sig   acetaminophen (TYLENOL) 650 MG CR tablet Take 650 mg by mouth every 8 (eight) hours as needed for pain.   No current facility-administered medications on file prior to visit.    Allergies:  Allergies  Allergen Reactions   Naproxen Hives   Robaxin [Methocarbamol] Rash    Social History:  Social History   Socioeconomic History   Marital status: Married    Spouse name: Not on file   Number of children: Not on file   Years of education: Not on file   Highest education level: Not on file  Occupational History   Not on file  Tobacco Use  Smoking status: Never    Passive exposure: Never   Smokeless tobacco: Never  Vaping Use   Vaping status: Never Used  Substance and Sexual Activity   Alcohol use: No   Drug use: No   Sexual activity: Yes  Other Topics Concern   Not on file  Social History Narrative   Not on file   Social Drivers of Health   Financial Resource Strain: Not on file  Food Insecurity: Not on file  Transportation Needs: Not on file  Physical Activity: Not on file  Stress: Not on file  Social Connections: Not on file  Intimate Partner Violence: Not on file   Social History   Tobacco Use  Smoking Status Never   Passive exposure: Never  Smokeless Tobacco Never   Social History   Substance and  Sexual Activity  Alcohol Use No    Family History:  Family History  Problem Relation Age of Onset   Hypertension Mother    Cancer Father        throat   COPD Father    AAA (abdominal aortic aneurysm) Father    Aneurysm Father        brain    Past medical history, surgical history, medications, allergies, family history and social history reviewed with patient today and changes made to appropriate areas of the chart.   Review of Systems  Constitutional: Negative.   HENT: Negative.    Eyes: Negative.   Respiratory: Negative.    Cardiovascular:  Positive for leg swelling (1 day after walking a long way). Negative for chest pain, palpitations, orthopnea, claudication and PND.  Gastrointestinal:  Positive for heartburn. Negative for abdominal pain, blood in stool, constipation, diarrhea, melena, nausea and vomiting.  Genitourinary: Negative.   Musculoskeletal:  Positive for myalgias and neck pain. Negative for back pain, falls and joint pain.  Skin: Negative.   Neurological:  Positive for headaches. Negative for dizziness, tingling, tremors, sensory change, speech change, focal weakness, seizures, loss of consciousness and weakness.  Endo/Heme/Allergies:  Negative for environmental allergies. Bruises/bleeds easily.  Psychiatric/Behavioral: Negative.     All other ROS negative except what is listed above and in the HPI.      Objective:    BP (!) 146/88   Pulse 84   Temp 98.4 F (36.9 C)   Ht 6' (1.829 m)   Wt 200 lb (90.7 kg)   SpO2 98%   BMI 27.12 kg/m   Wt Readings from Last 3 Encounters:  07/05/23 200 lb (90.7 kg)  09/11/22 191 lb 6.1 oz (86.8 kg)  06/08/22 202 lb (91.6 kg)    Physical Exam Vitals and nursing note reviewed.  Constitutional:      General: He is not in acute distress.    Appearance: Normal appearance. He is not ill-appearing, toxic-appearing or diaphoretic.  HENT:     Head: Normocephalic and atraumatic.     Right Ear: Tympanic membrane, ear canal  and external ear normal. There is no impacted cerumen.     Left Ear: Tympanic membrane, ear canal and external ear normal. There is no impacted cerumen.     Nose: Nose normal. No congestion or rhinorrhea.     Mouth/Throat:     Mouth: Mucous membranes are moist.     Pharynx: Oropharynx is clear. No oropharyngeal exudate or posterior oropharyngeal erythema.  Eyes:     General: No scleral icterus.       Right eye: No discharge.        Left  eye: No discharge.     Extraocular Movements: Extraocular movements intact.     Conjunctiva/sclera: Conjunctivae normal.     Pupils: Pupils are equal, round, and reactive to light.  Neck:     Vascular: No carotid bruit.  Cardiovascular:     Rate and Rhythm: Normal rate and regular rhythm.     Pulses: Normal pulses.     Heart sounds: No murmur heard.    No friction rub. No gallop.  Pulmonary:     Effort: Pulmonary effort is normal. No respiratory distress.     Breath sounds: Normal breath sounds. No stridor. No wheezing, rhonchi or rales.  Chest:     Chest wall: No tenderness.  Abdominal:     General: Abdomen is flat. Bowel sounds are normal. There is no distension.     Palpations: Abdomen is soft. There is no mass.     Tenderness: There is no abdominal tenderness. There is no right CVA tenderness, left CVA tenderness, guarding or rebound.     Hernia: No hernia is present.  Genitourinary:    Comments: Genital exam deferred with shared decision making Musculoskeletal:        General: No swelling, tenderness, deformity or signs of injury.     Cervical back: Normal range of motion and neck supple. No rigidity. No muscular tenderness.     Right lower leg: No edema.     Left lower leg: No edema.  Lymphadenopathy:     Cervical: No cervical adenopathy.  Skin:    General: Skin is warm and dry.     Capillary Refill: Capillary refill takes less than 2 seconds.     Coloration: Skin is not jaundiced or pale.     Findings: No bruising, erythema, lesion or  rash.  Neurological:     General: No focal deficit present.     Mental Status: He is alert and oriented to person, place, and time.     Cranial Nerves: No cranial nerve deficit.     Sensory: No sensory deficit.     Motor: No weakness.     Coordination: Coordination normal.     Gait: Gait normal.     Deep Tendon Reflexes: Reflexes normal.  Psychiatric:        Mood and Affect: Mood normal.        Behavior: Behavior normal.        Thought Content: Thought content normal.        Judgment: Judgment normal.     Results for orders placed or performed in visit on 07/05/23  Microalbumin, Urine Waived   Collection Time: 07/05/23  4:09 PM  Result Value Ref Range   Microalb, Ur Waived 30 (H) 0 - 19 mg/L   Creatinine, Urine Waived 300 10 - 300 mg/dL   Microalb/Creat Ratio <30 <30 mg/g      Assessment & Plan:   Problem List Items Addressed This Visit       Cardiovascular and Mediastinum   Hypertension   Running high. Will start 2.5mg  lisinopril and recheck in 3 months. Call with any concerns.       Relevant Medications   lisinopril (ZESTRIL) 2.5 MG tablet   Other Relevant Orders   Microalbumin, Urine Waived (Completed)   Comprehensive metabolic panel with GFR     Other   Hyperlipidemia   Rechecking labs today. Await results. Treat as needed.       Relevant Medications   lisinopril (ZESTRIL) 2.5 MG tablet   Other Relevant Orders  Comprehensive metabolic panel with GFR   Lipid Panel w/o Chol/HDL Ratio   Other Visit Diagnoses       Routine general medical examination at a health care facility    -  Primary   Vaccines up to date/declined. Screening labs checked today. Colonoscopy up to date. Continue diet and exercise. Call with any concerns.   Relevant Orders   Comprehensive metabolic panel with GFR   CBC with Differential/Platelet   Lipid Panel w/o Chol/HDL Ratio   PSA   TSH        LABORATORY TESTING:  Health maintenance labs ordered today as discussed above.    The natural history of prostate cancer and ongoing controversy regarding screening and potential treatment outcomes of prostate cancer has been discussed with the patient. The meaning of a false positive PSA and a false negative PSA has been discussed. He indicates understanding of the limitations of this screening test and wishes to proceed with screening PSA testing.   IMMUNIZATIONS:   - Tdap: Tetanus vaccination status reviewed: last tetanus booster within 10 years. - Influenza: Refused - Pneumovax: Not applicable - Prevnar: Not applicable - COVID: Refused - HPV: Not applicable - Shingrix vaccine: Refused  SCREENING: - Colonoscopy: Up to date  Discussed with patient purpose of the colonoscopy is to detect colon cancer at curable precancerous or early stages    PATIENT COUNSELING:    Sexuality: Discussed sexually transmitted diseases, partner selection, use of condoms, avoidance of unintended pregnancy  and contraceptive alternatives.   Advised to avoid cigarette smoking.  I discussed with the patient that most people either abstain from alcohol or drink within safe limits (<=14/week and <=4 drinks/occasion for males, <=7/weeks and <= 3 drinks/occasion for females) and that the risk for alcohol disorders and other health effects rises proportionally with the number of drinks per week and how often a drinker exceeds daily limits.  Discussed cessation/primary prevention of drug use and availability of treatment for abuse.   Diet: Encouraged to adjust caloric intake to maintain  or achieve ideal body weight, to reduce intake of dietary saturated fat and total fat, to limit sodium intake by avoiding high sodium foods and not adding table salt, and to maintain adequate dietary potassium and calcium preferably from fresh fruits, vegetables, and low-fat dairy products.    stressed the importance of regular exercise  Injury prevention: Discussed safety belts, safety helmets, smoke  detector, smoking near bedding or upholstery.   Dental health: Discussed importance of regular tooth brushing, flossing, and dental visits.   Follow up plan: NEXT PREVENTATIVE PHYSICAL DUE IN 1 YEAR. Return in about 3 months (around 10/04/2023).

## 2023-07-06 ENCOUNTER — Encounter: Payer: Self-pay | Admitting: Family Medicine

## 2023-07-06 LAB — COMPREHENSIVE METABOLIC PANEL WITH GFR
ALT: 17 IU/L (ref 0–44)
AST: 18 IU/L (ref 0–40)
Albumin: 4.4 g/dL (ref 3.8–4.9)
Alkaline Phosphatase: 72 IU/L (ref 44–121)
BUN/Creatinine Ratio: 13 (ref 9–20)
BUN: 15 mg/dL (ref 6–24)
Bilirubin Total: 0.5 mg/dL (ref 0.0–1.2)
CO2: 25 mmol/L (ref 20–29)
Calcium: 9.4 mg/dL (ref 8.7–10.2)
Chloride: 100 mmol/L (ref 96–106)
Creatinine, Ser: 1.15 mg/dL (ref 0.76–1.27)
Globulin, Total: 2.5 g/dL (ref 1.5–4.5)
Glucose: 86 mg/dL (ref 70–99)
Potassium: 3.9 mmol/L (ref 3.5–5.2)
Sodium: 141 mmol/L (ref 134–144)
Total Protein: 6.9 g/dL (ref 6.0–8.5)
eGFR: 76 mL/min/{1.73_m2} (ref >59)

## 2023-07-06 LAB — CBC WITH DIFFERENTIAL/PLATELET
Basophils Absolute: 0.1 10*3/uL (ref 0.0–0.2)
Basos: 1 %
EOS (ABSOLUTE): 0.1 10*3/uL (ref 0.0–0.4)
Eos: 1 %
Hematocrit: 46.2 % (ref 37.5–51.0)
Hemoglobin: 15.3 g/dL (ref 13.0–17.7)
Immature Grans (Abs): 0.1 10*3/uL (ref 0.0–0.1)
Immature Granulocytes: 1 %
Lymphocytes Absolute: 2.3 10*3/uL (ref 0.7–3.1)
Lymphs: 31 %
MCH: 29.1 pg (ref 26.6–33.0)
MCHC: 33.1 g/dL (ref 31.5–35.7)
MCV: 88 fL (ref 79–97)
Monocytes Absolute: 0.7 10*3/uL (ref 0.1–0.9)
Monocytes: 9 %
Neutrophils Absolute: 4.3 10*3/uL (ref 1.4–7.0)
Neutrophils: 57 %
Platelets: 222 10*3/uL (ref 150–450)
RBC: 5.26 x10E6/uL (ref 4.14–5.80)
RDW: 13.1 % (ref 11.6–15.4)
WBC: 7.5 10*3/uL (ref 3.4–10.8)

## 2023-07-06 LAB — LIPID PANEL W/O CHOL/HDL RATIO
Cholesterol, Total: 237 mg/dL — ABNORMAL HIGH (ref 100–199)
HDL: 37 mg/dL — ABNORMAL LOW (ref >39)
LDL Chol Calc (NIH): 143 mg/dL — ABNORMAL HIGH (ref 0–99)
Triglycerides: 310 mg/dL — ABNORMAL HIGH (ref 0–149)
VLDL Cholesterol Cal: 57 mg/dL — ABNORMAL HIGH (ref 5–40)

## 2023-07-06 LAB — PSA: Prostate Specific Ag, Serum: 1.6 ng/mL (ref 0.0–4.0)

## 2023-07-06 LAB — TSH: TSH: 1.3 u[IU]/mL (ref 0.450–4.500)

## 2023-10-05 ENCOUNTER — Encounter: Payer: Self-pay | Admitting: Family Medicine

## 2023-10-05 ENCOUNTER — Ambulatory Visit (INDEPENDENT_AMBULATORY_CARE_PROVIDER_SITE_OTHER): Payer: Self-pay | Admitting: Family Medicine

## 2023-10-05 VITALS — BP 136/88 | HR 66 | Temp 98.0°F | Ht 72.0 in | Wt 204.8 lb

## 2023-10-05 DIAGNOSIS — Z789 Other specified health status: Secondary | ICD-10-CM

## 2023-10-05 DIAGNOSIS — I10 Essential (primary) hypertension: Secondary | ICD-10-CM

## 2023-10-05 DIAGNOSIS — R202 Paresthesia of skin: Secondary | ICD-10-CM

## 2023-10-05 DIAGNOSIS — E782 Mixed hyperlipidemia: Secondary | ICD-10-CM

## 2023-10-05 MED ORDER — LISINOPRIL 2.5 MG PO TABS: 2.5000 mg | ORAL_TABLET | Freq: Every day | ORAL | 1 refills | Status: AC

## 2023-10-05 NOTE — Progress Notes (Signed)
 BP 136/88   Pulse 66   Temp 98 F (36.7 C) (Oral)   Ht 6' (1.829 m)   Wt 204 lb 12.8 oz (92.9 kg)   SpO2 99%   BMI 27.78 kg/m    Subjective:    Patient ID: Nathaniel Young Nathaniel Young., male    DOB: [birth year], 53 y.o.   MRN: [id number]  HPI: Nathaniel Young. is a 53 y.o. male  Chief Complaint  Patient presents with   Hypertension   HYPERTENSION / HYPERLIPIDEMIA Satisfied with current treatment? yes Duration of hypertension: chronic BP monitoring frequency: few times a month BP range: 120s  BP medication side effects: no Past BP meds: lisinopril  Duration of hyperlipidemia: chronic Cholesterol medication side effects: no Cholesterol supplements: red yeast rice Past cholesterol medications: none Medication compliance: excellent compliance Aspirin: no Recent stressors: no Recurrent headaches: no Visual changes: no Palpitations: no Dyspnea: no Chest pain: no Lower extremity edema: no Dizzy/lightheaded: no   Relevant past medical, surgical, family and social history reviewed and updated as indicated. Interim medical history since our last visit reviewed. Allergies and medications reviewed and updated.  Review of Systems  Constitutional: Negative.   Respiratory: Negative.    Cardiovascular: Negative.   Musculoskeletal: Negative.   Neurological:  Positive for numbness. Negative for dizziness, tremors, seizures, syncope, facial asymmetry, speech difficulty, weakness, light-headedness and headaches.  Psychiatric/Behavioral: Negative.      Per HPI unless specifically indicated above     Objective:    BP 136/88   Pulse 66   Temp 98 F (36.7 C) (Oral)   Ht 6' (1.829 m)   Wt 204 lb 12.8 oz (92.9 kg)   SpO2 99%   BMI 27.78 kg/m   Wt Readings from Last 3 Encounters:  10/05/23 204 lb 12.8 oz (92.9 kg)  07/05/23 200 lb (90.7 kg)  09/11/22 191 lb 6.1 oz (86.8 kg)    Physical Exam Vitals and nursing note reviewed.  Constitutional:      General: He is not in  acute distress.    Appearance: Normal appearance. He is not ill-appearing, toxic-appearing or diaphoretic.  HENT:     Head: Normocephalic and atraumatic.     Right Ear: External ear normal.     Left Ear: External ear normal.     Nose: Nose normal.     Mouth/Throat:     Mouth: Mucous membranes are moist.     Pharynx: Oropharynx is clear.   Eyes:     General: No scleral icterus.       Right eye: No discharge.        Left eye: No discharge.     Extraocular Movements: Extraocular movements intact.     Conjunctiva/sclera: Conjunctivae normal.     Pupils: Pupils are equal, round, and reactive to light.    Cardiovascular:     Rate and Rhythm: Normal rate and regular rhythm.     Pulses: Normal pulses.     Heart sounds: Normal heart sounds. No murmur heard.    No friction rub. No gallop.  Pulmonary:     Effort: Pulmonary effort is normal. No respiratory distress.     Breath sounds: Normal breath sounds. No stridor. No wheezing, rhonchi or rales.  Chest:     Chest wall: No tenderness.   Musculoskeletal:        General: Normal range of motion.     Cervical back: Normal range of motion and neck supple.   Skin:    General: Skin  is warm and dry.     Capillary Refill: Capillary refill takes less than 2 seconds.     Coloration: Skin is not jaundiced or pale.     Findings: No bruising, erythema, lesion or rash.   Neurological:     General: No focal deficit present.     Mental Status: He is alert and oriented to person, place, and time. Mental status is at baseline.   Psychiatric:        Mood and Affect: Mood normal.        Behavior: Behavior normal.        Thought Content: Thought content normal.        Judgment: Judgment normal.     Results for orders placed or performed in visit on 07/05/23  Microalbumin, Urine Waived   Collection Time: 07/05/23  4:09 PM  Result Value Ref Range   Microalb, Ur Waived 30 (H) 0 - 19 mg/L   Creatinine, Urine Waived 300 10 - 300 mg/dL    Microalb/Creat Ratio <30 <30 mg/g  Comprehensive metabolic panel with GFR   Collection Time: 07/05/23  4:10 PM  Result Value Ref Range   Glucose 86 70 - 99 mg/dL   BUN 15 6 - 24 mg/dL   Creatinine, Ser 8.84 0.76 - 1.27 mg/dL   eGFR 76 >40 fO/fpw/8.26   BUN/Creatinine Ratio 13 9 - 20   Sodium 141 134 - 144 mmol/L   Potassium 3.9 3.5 - 5.2 mmol/L   Chloride 100 96 - 106 mmol/L   CO2 25 20 - 29 mmol/L   Calcium 9.4 8.7 - 10.2 mg/dL   Total Protein 6.9 6.0 - 8.5 g/dL   Albumin 4.4 3.8 - 4.9 g/dL   Globulin, Total 2.5 1.5 - 4.5 g/dL   Bilirubin Total 0.5 0.0 - 1.2 mg/dL   Alkaline Phosphatase 72 44 - 121 IU/L   AST 18 0 - 40 IU/L   ALT 17 0 - 44 IU/L  CBC with Differential/Platelet   Collection Time: 07/05/23  4:10 PM  Result Value Ref Range   WBC 7.5 3.4 - 10.8 x10E3/uL   RBC 5.26 4.14 - 5.80 x10E6/uL   Hemoglobin 15.3 13.0 - 17.7 g/dL   Hematocrit 53.7 62.4 - 51.0 %   MCV 88 79 - 97 fL   MCH 29.1 26.6 - 33.0 pg   MCHC 33.1 31.5 - 35.7 g/dL   RDW 86.8 88.3 - 84.5 %   Platelets 222 150 - 450 x10E3/uL   Neutrophils 57 Not Estab. %   Lymphs 31 Not Estab. %   Monocytes 9 Not Estab. %   Eos 1 Not Estab. %   Basos 1 Not Estab. %   Neutrophils Absolute 4.3 1.4 - 7.0 x10E3/uL   Lymphocytes Absolute 2.3 0.7 - 3.1 x10E3/uL   Monocytes Absolute 0.7 0.1 - 0.9 x10E3/uL   EOS (ABSOLUTE) 0.1 0.0 - 0.4 x10E3/uL   Basophils Absolute 0.1 0.0 - 0.2 x10E3/uL   Immature Granulocytes 1 Not Estab. %   Immature Grans (Abs) 0.1 0.0 - 0.1 x10E3/uL  Lipid Panel w/o Chol/HDL Ratio   Collection Time: 07/05/23  4:10 PM  Result Value Ref Range   Cholesterol, Total 237 (H) 100 - 199 mg/dL   Triglycerides 689 (H) 0 - 149 mg/dL   HDL 37 (L) >60 mg/dL   VLDL Cholesterol Cal 57 (H) 5 - 40 mg/dL   LDL Chol Calc (NIH) 856 (H) 0 - 99 mg/dL  PSA   Collection Time: 07/05/23  4:10 PM  Result Value Ref Range   Prostate Specific Ag, Serum 1.6 0.0 - 4.0 ng/mL  TSH   Collection Time: 07/05/23  4:10 PM   Result Value Ref Range   TSH 1.300 0.450 - 4.500 uIU/mL      Assessment & Plan:   Problem List Items Addressed This Visit       Cardiovascular and Mediastinum   Hypertension - Primary   Under good control on current regimen. Continue current regimen. Continue to monitor. Call with any concerns. Refills given. Labs drawn today.        Relevant Medications   lisinopril  (ZESTRIL ) 2.5 MG tablet   Other Relevant Orders   Lipid Panel w/o Chol/HDL Ratio   Comprehensive metabolic panel with GFR     Other   Hyperlipidemia   Rechecking labs today. Await results. Treat as needed.       Relevant Medications   lisinopril  (ZESTRIL ) 2.5 MG tablet   Other Relevant Orders   Lipid Panel w/o Chol/HDL Ratio   Comprehensive metabolic panel with GFR   Other Visit Diagnoses       Paresthesia       Only on lateral side of R big toe. Likely irritation from shoes or arthritis. Will refer to podiatry. Call with any concerns.   Relevant Orders   Ambulatory referral to Podiatry     Hepatitis B vaccination status unknown       Labs drawn today. Await results.   Relevant Orders   Hepatitis B surface antibody,quantitative        Follow up plan: Return in about 6 months (around 04/06/2024).

## 2023-10-05 NOTE — Assessment & Plan Note (Signed)
 Rechecking labs today. Await results. Treat as needed.

## 2023-10-05 NOTE — Assessment & Plan Note (Signed)
 Under good control on current regimen. Continue current regimen. Continue to monitor. Call with any concerns. Refills given. Labs drawn today.

## 2023-10-06 LAB — LIPID PANEL W/O CHOL/HDL RATIO
Cholesterol, Total: 247 mg/dL — ABNORMAL HIGH (ref 100–199)
HDL: 39 mg/dL — ABNORMAL LOW (ref >39)
LDL Chol Calc (NIH): 146 mg/dL — ABNORMAL HIGH (ref 0–99)
Triglycerides: 336 mg/dL — ABNORMAL HIGH (ref 0–149)
VLDL Cholesterol Cal: 62 mg/dL — ABNORMAL HIGH (ref 5–40)

## 2023-10-06 LAB — COMPREHENSIVE METABOLIC PANEL WITH GFR
ALT: 16 IU/L (ref 0–44)
AST: 15 IU/L (ref 0–40)
Albumin: 4.5 g/dL (ref 3.8–4.9)
Alkaline Phosphatase: 79 IU/L (ref 44–121)
BUN/Creatinine Ratio: 13 (ref 9–20)
BUN: 16 mg/dL (ref 6–24)
Bilirubin Total: 0.3 mg/dL (ref 0.0–1.2)
CO2: 21 mmol/L (ref 20–29)
Calcium: 9.9 mg/dL (ref 8.7–10.2)
Chloride: 102 mmol/L (ref 96–106)
Creatinine, Ser: 1.21 mg/dL (ref 0.76–1.27)
Globulin, Total: 2.5 g/dL (ref 1.5–4.5)
Glucose: 101 mg/dL — ABNORMAL HIGH (ref 70–99)
Potassium: 4.8 mmol/L (ref 3.5–5.2)
Sodium: 141 mmol/L (ref 134–144)
Total Protein: 7 g/dL (ref 6.0–8.5)
eGFR: 72 mL/min/{1.73_m2} (ref >59)

## 2023-10-06 LAB — HEPATITIS B SURFACE ANTIBODY, QUANTITATIVE: Hepatitis B Surf Ab Quant: <3.5 m[IU]/mL — ABNORMAL LOW

## 2023-10-10 ENCOUNTER — Ambulatory Visit: Payer: Self-pay | Admitting: Family Medicine

## 2023-11-05 ENCOUNTER — Telehealth (INDEPENDENT_AMBULATORY_CARE_PROVIDER_SITE_OTHER): Payer: Self-pay | Admitting: Family Medicine

## 2023-11-05 ENCOUNTER — Encounter: Payer: Self-pay | Admitting: Family Medicine

## 2023-11-05 DIAGNOSIS — N200 Calculus of kidney: Secondary | ICD-10-CM

## 2023-11-05 MED ORDER — OXYCODONE-ACETAMINOPHEN 10-325 MG PO TABS: 1.0000 | ORAL_TABLET | Freq: Three times a day (TID) | ORAL | 0 refills | Status: AC | PRN

## 2023-11-05 NOTE — Progress Notes (Signed)
 There were no vitals taken for this visit.   Subjective:    Patient ID: Nathaniel Young., male    DOB: Sep 11, 1970, 53 y.o.   MRN: [id number]  HPI: Nathaniel Young. is a 53 y.o. male  Chief Complaint  Patient presents with   Flank Pain    Started about a week ago but feels since this morning the pain is lower and in near his bladder rather than kidney.    URINARY SYMPTOMS Duration: about a week ago, but then got way worse at 4AM Dysuria: no Urinary frequency: no Urgency: no Small volume voids: no Symptom severity: no Urinary incontinence: no Foul odor: no Hematuria: yes Abdominal pain: no Back pain: yes Suprapubic pain/pressure: no Flank pain: yes Fever:  no Vomiting: no Relief with cranberry juice: no Relief with pyridium: no Status: better Previous urinary tract infection: no Recurrent urinary tract infection: no Sexual activity: No sexually active/monogomous/practicing safe sex History of sexually transmitted disease: no Penile discharge: no Treatments attempted: old flomax  at home and increasing fluids    Relevant past medical, surgical, family and social history reviewed and updated as indicated. Interim medical history since our last visit reviewed. Allergies and medications reviewed and updated.  Review of Systems  Constitutional: Negative.   Respiratory: Negative.    Cardiovascular: Negative.   Genitourinary:  Positive for enuresis, flank pain and hematuria. Negative for decreased urine volume, difficulty urinating, dysuria, frequency, genital sores, penile discharge, penile pain, penile swelling, scrotal swelling, testicular pain and urgency.  Psychiatric/Behavioral: Negative.      Per HPI unless specifically indicated above     Objective:    There were no vitals taken for this visit.  Wt Readings from Last 3 Encounters:  10/05/23 204 lb 12.8 oz (92.9 kg)  07/05/23 200 lb (90.7 kg)  09/11/22 191 lb 6.1 oz (86.8 kg)    Physical  Exam Vitals and nursing note reviewed.  Constitutional:      General: He is not in acute distress.    Appearance: Normal appearance. He is not ill-appearing, toxic-appearing or diaphoretic.  HENT:     Head: Normocephalic and atraumatic.     Right Ear: External ear normal.     Left Ear: External ear normal.     Nose: Nose normal.     Mouth/Throat:     Mouth: Mucous membranes are moist.     Pharynx: Oropharynx is clear.  Eyes:     General: No scleral icterus.       Right eye: No discharge.        Left eye: No discharge.     Conjunctiva/sclera: Conjunctivae normal.     Pupils: Pupils are equal, round, and reactive to light.  Pulmonary:     Effort: Pulmonary effort is normal. No respiratory distress.     Comments: Speaking in full sentences Musculoskeletal:        General: Normal range of motion.     Cervical back: Normal range of motion.  Skin:    Coloration: Skin is not jaundiced or pale.     Findings: No bruising, erythema, lesion or rash.  Neurological:     Mental Status: He is alert and oriented to person, place, and time. Mental status is at baseline.  Psychiatric:        Mood and Affect: Mood normal.        Behavior: Behavior normal.        Thought Content: Thought content normal.  Judgment: Judgment normal.     Results for orders placed or performed in visit on 10/05/23  Lipid Panel w/o Chol/HDL Ratio   Collection Time: 10/05/23  9:30 AM  Result Value Ref Range   Cholesterol, Total 247 (H) 100 - 199 mg/dL   Triglycerides 663 (H) 0 - 149 mg/dL   HDL 39 (L) >60 mg/dL   VLDL Cholesterol Cal 62 (H) 5 - 40 mg/dL   LDL Chol Calc (NIH) 853 (H) 0 - 99 mg/dL  Comprehensive metabolic panel with GFR   Collection Time: 10/05/23  9:30 AM  Result Value Ref Range   Glucose 101 (H) 70 - 99 mg/dL   BUN 16 6 - 24 mg/dL   Creatinine, Ser 8.78 0.76 - 1.27 mg/dL   eGFR 72 >40 fO/fpw/8.26   BUN/Creatinine Ratio 13 9 - 20   Sodium 141 134 - 144 mmol/L   Potassium 4.8 3.5 -  5.2 mmol/L   Chloride 102 96 - 106 mmol/L   CO2 21 20 - 29 mmol/L   Calcium 9.9 8.7 - 10.2 mg/dL   Total Protein 7.0 6.0 - 8.5 g/dL   Albumin 4.5 3.8 - 4.9 g/dL   Globulin, Total 2.5 1.5 - 4.5 g/dL   Bilirubin Total 0.3 0.0 - 1.2 mg/dL   Alkaline Phosphatase 79 44 - 121 IU/L   AST 15 0 - 40 IU/L   ALT 16 0 - 44 IU/L  Hepatitis B surface antibody,quantitative   Collection Time: 10/05/23  9:30 AM  Result Value Ref Range   Hepatitis B Surf Ab Quant <3.5 (L) Immunity>10 mIU/mL      Assessment & Plan:   Problem List Items Addressed This Visit       Genitourinary   Kidney stone - Primary   Started flomax  at home. Will treat pain. Call if not getting better or getting worse. Warning signs to go to ER discussed.       Relevant Medications   oxyCODONE -acetaminophen (PERCOCET) 10-325 MG tablet     Follow up plan: Return for As scheduled.    This visit was completed via video visit through MyChart due to the restrictions of the COVID-19 pandemic. All issues as above were discussed and addressed. Physical exam was done as above through visual confirmation on video through MyChart. If it was felt that the patient should be evaluated in the office, they were directed there. The patient verbally consented to this visit. Location of the patient: home Location of the provider: work Those involved with this call:  Provider: Duwaine Louder, DO CMA: Izetta Sarah, CMA Front Desk/Registration: Claretta Maiden  Time spent on call: 15 minutes with patient face to face via video conference. More than 50% of this time was spent in counseling and coordination of care. 23 minutes total spent in review of patient's record and preparation of their chart.

## 2023-11-05 NOTE — Assessment & Plan Note (Signed)
 Started flomax  at home. Will treat pain. Call if not getting better or getting worse. Warning signs to go to ER discussed.

## 2024-04-07 ENCOUNTER — Ambulatory Visit: Payer: Self-pay | Admitting: Family Medicine
